# Patient Record
Sex: Male | Born: 1960 | Race: White | Hispanic: No | Marital: Married | Smoking: Never smoker
Health system: Southern US, Community
[De-identification: ages and names within clinical notes are randomized; demographics above are authoritative.]

## PROBLEM LIST (undated history)

## (undated) DIAGNOSIS — I10 Essential (primary) hypertension: Secondary | ICD-10-CM

## (undated) HISTORY — PX: OTHER SURGICAL HISTORY: SHX169

## (undated) HISTORY — DX: Essential (primary) hypertension: I10

---

## 2000-02-05 ENCOUNTER — Observation Stay (HOSPITAL_COMMUNITY): Admission: EM | Admit: 2000-02-05 | Discharge: 2000-02-06 | Payer: Self-pay | Admitting: Emergency Medicine

## 2001-12-22 ENCOUNTER — Ambulatory Visit (HOSPITAL_COMMUNITY): Admission: RE | Admit: 2001-12-22 | Discharge: 2001-12-22 | Payer: Self-pay | Admitting: Orthopedic Surgery

## 2001-12-22 ENCOUNTER — Encounter: Payer: Self-pay | Admitting: Orthopedic Surgery

## 2001-12-24 ENCOUNTER — Encounter: Payer: Self-pay | Admitting: Orthopedic Surgery

## 2001-12-24 ENCOUNTER — Encounter: Admission: RE | Admit: 2001-12-24 | Discharge: 2001-12-24 | Payer: Self-pay | Admitting: Radiology

## 2002-02-07 ENCOUNTER — Encounter: Admission: RE | Admit: 2002-02-07 | Discharge: 2002-02-07 | Payer: Self-pay | Admitting: Orthopedic Surgery

## 2002-02-07 ENCOUNTER — Encounter: Payer: Self-pay | Admitting: Orthopedic Surgery

## 2005-04-17 ENCOUNTER — Ambulatory Visit: Payer: Self-pay | Admitting: Internal Medicine

## 2005-04-25 ENCOUNTER — Ambulatory Visit: Payer: Self-pay | Admitting: Internal Medicine

## 2006-04-19 ENCOUNTER — Ambulatory Visit: Payer: Self-pay | Admitting: Internal Medicine

## 2006-04-24 ENCOUNTER — Ambulatory Visit: Payer: Self-pay | Admitting: Internal Medicine

## 2006-11-14 ENCOUNTER — Encounter: Admission: RE | Admit: 2006-11-14 | Discharge: 2006-11-14 | Payer: Self-pay | Admitting: Orthopedic Surgery

## 2007-06-10 DIAGNOSIS — I10 Essential (primary) hypertension: Secondary | ICD-10-CM | POA: Insufficient documentation

## 2007-11-18 ENCOUNTER — Telehealth: Payer: Self-pay | Admitting: Internal Medicine

## 2008-06-24 ENCOUNTER — Telehealth: Payer: Self-pay | Admitting: Internal Medicine

## 2008-09-17 ENCOUNTER — Telehealth (INDEPENDENT_AMBULATORY_CARE_PROVIDER_SITE_OTHER): Payer: Self-pay | Admitting: *Deleted

## 2008-09-18 ENCOUNTER — Ambulatory Visit: Payer: Self-pay | Admitting: Internal Medicine

## 2008-09-18 LAB — CONVERTED CEMR LAB
ALT: 55 units/L — ABNORMAL HIGH (ref 0–53)
AST: 40 units/L — ABNORMAL HIGH (ref 0–37)
Albumin: 4.2 g/dL (ref 3.5–5.2)
Alkaline Phosphatase: 73 units/L (ref 39–117)
BUN: 16 mg/dL (ref 6–23)
Basophils Absolute: 0 10*3/uL (ref 0.0–0.1)
Basophils Relative: 0.5 % (ref 0.0–3.0)
Bilirubin Urine: NEGATIVE
Bilirubin, Direct: 0.2 mg/dL (ref 0.0–0.3)
Blood in Urine, dipstick: NEGATIVE
CO2: 32 meq/L (ref 19–32)
Calcium: 9.6 mg/dL (ref 8.4–10.5)
Chloride: 102 meq/L (ref 96–112)
Cholesterol: 165 mg/dL (ref 0–200)
Creatinine, Ser: 0.9 mg/dL (ref 0.4–1.5)
Eosinophils Absolute: 0.2 10*3/uL (ref 0.0–0.7)
Eosinophils Relative: 4 % (ref 0.0–5.0)
GFR calc Af Amer: 115 mL/min
GFR calc non Af Amer: 95 mL/min
Glucose, Bld: 91 mg/dL (ref 70–99)
Glucose, Urine, Semiquant: NEGATIVE
HCT: 46.3 % (ref 39.0–52.0)
HDL: 31.9 mg/dL — ABNORMAL LOW (ref >39.0)
Hemoglobin: 16.4 g/dL (ref 13.0–17.0)
Ketones, urine, test strip: NEGATIVE
LDL Cholesterol: 107 mg/dL — ABNORMAL HIGH (ref 0–99)
Lymphocytes Relative: 37.5 % (ref 12.0–46.0)
MCHC: 35.5 g/dL (ref 30.0–36.0)
MCV: 90.6 fL (ref 78.0–100.0)
Monocytes Absolute: 0.6 10*3/uL (ref 0.1–1.0)
Monocytes Relative: 10.7 % (ref 3.0–12.0)
Neutro Abs: 2.7 10*3/uL (ref 1.4–7.7)
Neutrophils Relative %: 47.3 % (ref 43.0–77.0)
Nitrite: NEGATIVE
Platelets: 220 10*3/uL (ref 150–400)
Potassium: 3.5 meq/L (ref 3.5–5.1)
Protein, U semiquant: NEGATIVE
RBC: 5.1 M/uL (ref 4.22–5.81)
RDW: 11.9 % (ref 11.5–14.6)
Sodium: 143 meq/L (ref 135–145)
Specific Gravity, Urine: 1.01
TSH: 0.61 microintl units/mL (ref 0.35–5.50)
Total Bilirubin: 2.1 mg/dL — ABNORMAL HIGH (ref 0.3–1.2)
Total CHOL/HDL Ratio: 5.2
Total Protein: 7.2 g/dL (ref 6.0–8.3)
Triglycerides: 129 mg/dL (ref 0–149)
Urobilinogen, UA: 0.2
VLDL: 26 mg/dL (ref 0–40)
WBC Urine, dipstick: NEGATIVE
WBC: 5.6 10*3/uL (ref 4.5–10.5)
pH: 7

## 2008-09-23 ENCOUNTER — Ambulatory Visit: Payer: Self-pay | Admitting: Internal Medicine

## 2008-09-23 DIAGNOSIS — E785 Hyperlipidemia, unspecified: Secondary | ICD-10-CM | POA: Insufficient documentation

## 2008-09-24 ENCOUNTER — Telehealth: Payer: Self-pay | Admitting: *Deleted

## 2008-10-05 ENCOUNTER — Encounter: Payer: Self-pay | Admitting: Internal Medicine

## 2009-01-01 ENCOUNTER — Telehealth: Payer: Self-pay | Admitting: Internal Medicine

## 2009-03-22 ENCOUNTER — Ambulatory Visit: Payer: Self-pay | Admitting: Internal Medicine

## 2009-03-22 LAB — CONVERTED CEMR LAB
ALT: 47 units/L (ref 0–53)
AST: 36 units/L (ref 0–37)
Albumin: 4.1 g/dL (ref 3.5–5.2)
Alkaline Phosphatase: 67 units/L (ref 39–117)
Bilirubin, Direct: 0.1 mg/dL (ref 0.0–0.3)
Cholesterol: 168 mg/dL (ref 0–200)
HDL: 43.4 mg/dL (ref >39.00)
LDL Cholesterol: 93 mg/dL (ref 0–99)
Total Bilirubin: 1.6 mg/dL — ABNORMAL HIGH (ref 0.3–1.2)
Total CHOL/HDL Ratio: 4
Total Protein: 7.3 g/dL (ref 6.0–8.3)
Triglycerides: 159 mg/dL — ABNORMAL HIGH (ref 0.0–149.0)
VLDL: 31.8 mg/dL (ref 0.0–40.0)

## 2009-03-24 ENCOUNTER — Ambulatory Visit: Payer: Self-pay | Admitting: Internal Medicine

## 2009-03-24 DIAGNOSIS — R0789 Other chest pain: Secondary | ICD-10-CM | POA: Insufficient documentation

## 2009-03-30 ENCOUNTER — Telehealth (INDEPENDENT_AMBULATORY_CARE_PROVIDER_SITE_OTHER): Payer: Self-pay | Admitting: *Deleted

## 2009-03-31 ENCOUNTER — Encounter: Payer: Self-pay | Admitting: Internal Medicine

## 2009-03-31 ENCOUNTER — Ambulatory Visit: Payer: Self-pay

## 2009-03-31 ENCOUNTER — Encounter: Payer: Self-pay | Admitting: Cardiology

## 2009-04-05 ENCOUNTER — Telehealth: Payer: Self-pay | Admitting: Internal Medicine

## 2009-05-07 ENCOUNTER — Ambulatory Visit: Payer: Self-pay | Admitting: Internal Medicine

## 2009-07-14 ENCOUNTER — Telehealth: Payer: Self-pay | Admitting: Internal Medicine

## 2009-09-01 ENCOUNTER — Encounter (INDEPENDENT_AMBULATORY_CARE_PROVIDER_SITE_OTHER): Payer: Self-pay | Admitting: *Deleted

## 2010-06-07 ENCOUNTER — Telehealth: Payer: Self-pay | Admitting: Internal Medicine

## 2010-07-01 ENCOUNTER — Telehealth: Payer: Self-pay | Admitting: Internal Medicine

## 2010-11-06 ENCOUNTER — Encounter
Admission: RE | Admit: 2010-11-06 | Discharge: 2010-11-06 | Payer: Self-pay | Source: Home / Self Care | Attending: Orthopedic Surgery | Admitting: Orthopedic Surgery

## 2010-11-15 NOTE — Progress Notes (Signed)
  Phone Note Call from Patient   Summary of Call: wife requesting last cpx vitals and labs- wife informed ready for pcik up[ Initial call taken by: Willy Eddy, LPN,  June 07, 2010 3:30 PM

## 2010-11-15 NOTE — Progress Notes (Signed)
Summary: Kyle Mcdaniel  Phone Note Call from Patient   Caller: wife,barbara [phone] Summary of Call: Ambien for travel & jet lag.  Trip Oct. [facility] Should have time.   [name] Initial call taken by: Rudy Jew, RN,  July 01, 2010 8:25 AM  Follow-up for Phone Call        may  have ambien 10 mg #20 1 at bedtime pam sleep with 1 refill per dr Lovell Sheehan Follow-up by: Willy Eddy, LPN,  July 01, 2010 8:51 AM  Additional Follow-up for Phone Call Additional follow up Details #1::        Phone Call Completed Additional Follow-up by: Rudy Jew, RN,  July 01, 2010 9:34 AM    New/Updated Medications: AMBIEN 10 MG TABS (ZOLPIDEM TARTRATE) One hs as needed sleep Prescriptions: AMBIEN 10 MG TABS (ZOLPIDEM TARTRATE) One hs as needed sleep  #20 x 1   Entered by:   Rudy Jew, RN   Authorized by:   Stacie Glaze MD   Signed by:   Rudy Jew, RN on 07/01/2010   Method used:   Printed then faxed to ...       [facility] (mail-order)             , [place]         Ph: [phone]       Fax: [phone]   RxID:   [id number]

## 2011-03-03 NOTE — Op Note (Signed)
[facility]  Patient:    Kyle Mcdaniel, Kyle Mcdaniel                         MRN: [id number] Proc. Date: 02/05/00 Adm. Date:  [id number] Disc. Date: [id number] Attending:  Aldean Baker V                           Operative Report  PREOPERATIVE DIAGNOSIS:  Ruptured right Achilles tendon.  POSTOPERATIVE DIAGNOSIS:  Complete rupture of right Achilles tendon.  PROCEDURE:  Repair of right Achilles tendon.  SURGEON:  Nadara Mustard, M.D.  ANESTHESIA:  General endotracheal.  ESTIMATED BLOOD LOSS:  Minimal.  ANTIBIOTICS:  One gram of Kefzol.  TOURNIQUET TIME:  Forty-nine minutes at 300 mmHg.  DISPOSITION:  To PACU in stable condition.  INDICATIONS FOR PROCEDURE:  The patient is a 50 year old gentleman who was playing basketball and felt a pop in his right Achilles tendon yesterday, and is brought today for repair of his right Achilles tendon.  He had a palpable defect and a positive Thompson test and did not have plantar flexion with compression of the  calf.  The risks and benefits of surgery versus conservative nonoperative care ere discussed including infection, neurovascular injury, skin wound healing, rupture rate of the tendon, motor strength of the ankle, as well as regaining calf circumference.  The patient states he understands the risks and the complications of surgery, and wish to proceed at this time.  DESCRIPTION OF PROCEDURE:  The patient was brought to OR #14 and underwent a general anesthetic.  After an adequate level of anesthesia was obtained, the patient was placed prone on the operating table.  His right lower extremity was  prepped using DuraPrep, and draped into the sterile field.  An Collier Flowers was used to cover all exposed skin.  A posteromedial incision was made directly over the Achilles tendon which was carried down to the peri-Tenon which was split.  The Achilles tendon was completely ruptured, mid-substance.  The plantaris  tendon was intact.  The wound was irrigated.  Using two #2-0 Ethibond sutures, double-arm, two Krackow sutures were placed in the proximal stump with four sutures extending at the substance of the end, and two #2-0 Ethibond sutures were placed in the distal stump with four strands exiting the distal end.  This was then repaired with the ________.  The repair was then reinforced with 2-0 Ethibond as well with a total of eight sutures crossing the rupture.  The wound was then again irrigated. The peri-Tenon was closed using a running 3-0 Monocryl.  The skin was closed using n Allgower suture stitch with 2-0 nylon with no suture crossing the incision over the skin.  The wound was covered with Adaptic and orthopedic sponges, and a compressive Quincy Simmonds dressing was applied.  The patient was extubated and taken to PACU in stable condition.  Total tourniquet time 39 minutes.  Elevation was used for exsanguination. DD:  02/05/00 TD:  02/06/00 Job: [id number] UXL/[id number]

## 2011-03-12 ENCOUNTER — Other Ambulatory Visit: Payer: Self-pay | Admitting: Internal Medicine

## 2011-04-12 ENCOUNTER — Telehealth: Payer: Self-pay | Admitting: Internal Medicine

## 2011-04-12 MED ORDER — OLMESARTAN MEDOXOMIL-HCTZ 40-25 MG PO TABS: 1.0000 | ORAL_TABLET | Freq: Every day | ORAL | Status: DC

## 2011-04-12 NOTE — Telephone Encounter (Signed)
Pt req to get work in ov with fasting labs prior to visit, so pt can get refill of med. Also req samples of Benicar /diurectic or call in to [facility].

## 2011-04-12 NOTE — Telephone Encounter (Signed)
Just will have to give an open appointment and dr Lovell Sheehan will have to order labs when here- has not been seen in over 2 years and he will need to see him before ordering labs- no samples available

## 2011-04-12 NOTE — Telephone Encounter (Signed)
Disregard first message- schedule cpx in next 3 months and will give him refills for 3 months to schedule cpx

## 2011-05-26 ENCOUNTER — Other Ambulatory Visit: Payer: Self-pay

## 2011-05-26 ENCOUNTER — Other Ambulatory Visit: Payer: Self-pay | Admitting: Internal Medicine

## 2011-05-26 DIAGNOSIS — Z0000 Encounter for general adult medical examination without abnormal findings: Secondary | ICD-10-CM

## 2011-05-29 ENCOUNTER — Other Ambulatory Visit (INDEPENDENT_AMBULATORY_CARE_PROVIDER_SITE_OTHER): Payer: Self-pay

## 2011-05-29 DIAGNOSIS — Z0000 Encounter for general adult medical examination without abnormal findings: Secondary | ICD-10-CM

## 2011-05-29 LAB — HEPATIC FUNCTION PANEL
ALT: 34 U/L (ref 0–53)
Alkaline Phosphatase: 60 U/L (ref 39–117)
Bilirubin, Direct: 0.2 mg/dL (ref 0.0–0.3)
Total Bilirubin: 1.5 mg/dL — ABNORMAL HIGH (ref 0.3–1.2)
Total Protein: 7.4 g/dL (ref 6.0–8.3)

## 2011-05-29 LAB — BASIC METABOLIC PANEL
Calcium: 9.2 mg/dL (ref 8.4–10.5)
Chloride: 100 mEq/L (ref 96–112)
Creatinine, Ser: 1 mg/dL (ref 0.4–1.5)
GFR: 85.43 mL/min (ref >60.00)

## 2011-05-29 LAB — URINALYSIS
Ketones, ur: NEGATIVE
Specific Gravity, Urine: 1.02 (ref 1.000–1.030)
Total Protein, Urine: NEGATIVE
Urine Glucose: NEGATIVE
pH: 7 (ref 5.0–8.0)

## 2011-05-29 LAB — CBC WITH DIFFERENTIAL/PLATELET
Basophils Relative: 0.8 % (ref 0.0–3.0)
Eosinophils Absolute: 0.1 10*3/uL (ref 0.0–0.7)
Eosinophils Relative: 2.9 % (ref 0.0–5.0)
HCT: 45.1 % (ref 39.0–52.0)
Lymphs Abs: 1.6 10*3/uL (ref 0.7–4.0)
MCHC: 34.6 g/dL (ref 30.0–36.0)
MCV: 90 fl (ref 78.0–100.0)
Monocytes Absolute: 0.6 10*3/uL (ref 0.1–1.0)
Neutrophils Relative %: 52.6 % (ref 43.0–77.0)
Platelets: 233 10*3/uL (ref 150.0–400.0)
RBC: 5.01 Mil/uL (ref 4.22–5.81)

## 2011-05-29 LAB — LIPID PANEL
Cholesterol: 165 mg/dL (ref 0–200)
Triglycerides: 147 mg/dL (ref 0.0–149.0)

## 2011-05-30 ENCOUNTER — Encounter: Payer: Self-pay | Admitting: Internal Medicine

## 2011-06-02 ENCOUNTER — Ambulatory Visit (INDEPENDENT_AMBULATORY_CARE_PROVIDER_SITE_OTHER): Payer: BC Managed Care – PPO | Admitting: Internal Medicine

## 2011-06-02 ENCOUNTER — Encounter: Payer: Self-pay | Admitting: Internal Medicine

## 2011-06-02 VITALS — BP 160/90 | HR 80 | Temp 98.2°F | Resp 16 | Ht 72.0 in | Wt 242.0 lb

## 2011-06-02 DIAGNOSIS — I10 Essential (primary) hypertension: Secondary | ICD-10-CM

## 2011-06-02 DIAGNOSIS — Z125 Encounter for screening for malignant neoplasm of prostate: Secondary | ICD-10-CM

## 2011-06-02 DIAGNOSIS — Z0000 Encounter for general adult medical examination without abnormal findings: Secondary | ICD-10-CM

## 2011-06-02 MED ORDER — ATENOLOL 50 MG PO TABS: 50.0000 mg | ORAL_TABLET | Freq: Every day | ORAL | Status: DC

## 2011-06-02 NOTE — Progress Notes (Signed)
Subjective:    Patient ID: Kyle Mcdaniel, male    DOB: Oct 17, 1960, 50 y.o.   MRN: [id number]  HPI  Kyle Mcdaniel is a 50 year old white male presents for complete physical examination.  He's been doing remarkably well as been 2 years since his last physical examination.  He has been exercising on a regular basis he states the exercises for 45 minutes of cardio 3 times a week his blood pressure however is elevated and we have noticed a trend towards elevating blood pressure over the past year.  He states that there is a strong family history for hypertension in his father has hypertension difficult to control. He has been on his current medications for greater than 2 years he's recently changed to generic Toprol  Review of Systems  Constitutional: Negative for fever and fatigue.  HENT: Negative for hearing loss, congestion, neck pain and postnasal drip.   Eyes: Negative for discharge, redness and visual disturbance.  Respiratory: Negative for cough, shortness of breath and wheezing.   Cardiovascular: Negative for leg swelling.  Gastrointestinal: Negative for abdominal pain, constipation and abdominal distention.  Genitourinary: Negative for urgency and frequency.  Musculoskeletal: Negative for joint swelling and arthralgias.  Skin: Negative for color change and rash.  Neurological: Negative for weakness and light-headedness.  Hematological: Negative for adenopathy.  Psychiatric/Behavioral: Negative for behavioral problems.   Past Medical History  Diagnosis Date  . Hypertension    Past Surgical History  Procedure Date  . Sebacceous cyst of knee     reports that he has never smoked. He does not have any smokeless tobacco history on file. He reports that he drinks alcohol. He reports that he does not use illicit drugs. family history includes Hypertension in an unspecified family member. No Known Allergies     Objective:   Physical Exam  Constitutional: He is oriented to person,  place, and time. He appears well-developed and well-nourished.  HENT:  Head: Normocephalic and atraumatic.  Eyes: Conjunctivae are normal. Pupils are equal, round, and reactive to light.  Neck: Normal range of motion. Neck supple.  Cardiovascular: Normal rate and regular rhythm.   Pulmonary/Chest: Effort normal and breath sounds normal.  Abdominal: Soft. Bowel sounds are normal.  Genitourinary: Rectum normal and prostate normal.  Musculoskeletal: Normal range of motion.       Some tenderness over the anterior cruciate ligament on the right  Neurological: He is alert and oriented to person, place, and time.  Skin: Skin is warm and dry.  Psychiatric: He has a normal mood and affect. His behavior is normal. Thought content normal.          Assessment & Plan:   Patient presents for yearly preventative medicine examination.   all immunizations and health maintenance protocols were reviewed with the patient and they are up to date with these protocols.   screening laboratory values were reviewed with the patient including screening of hyperlipidemia PSA renal function and hepatic function.   There medications past medical history social history problem list and allergies were reviewed in detail.   Goals were established with regard to weight loss exercise diet in compliance with medications   Given his family his factors control her blood pressure is essential his weight is stable believed that the generic Toprol may pay a full because he has been responsive to beta blockers in the past his pulse by my examination was 95. We will change him from Toprol to atenolol 50 mg by mouth daily and monitor  his blood pressure we have also added a PSA which is based screening labs we will follow the results and notify

## 2011-06-03 LAB — PSA, TOTAL AND FREE
PSA, Free Pct: 41 % (ref >25)
PSA, Free: 0.15 ng/mL

## 2011-06-10 ENCOUNTER — Other Ambulatory Visit: Payer: Self-pay | Admitting: Internal Medicine

## 2011-06-14 MED ORDER — METOPROLOL SUCCINATE ER 25 MG PO TB24: 25.0000 mg | ORAL_TABLET | Freq: Every day | ORAL | Status: DC

## 2011-06-15 ENCOUNTER — Telehealth: Payer: Self-pay | Admitting: *Deleted

## 2011-06-15 ENCOUNTER — Other Ambulatory Visit: Payer: Self-pay | Admitting: *Deleted

## 2011-06-15 MED ORDER — AMLODIPINE BESYLATE 2.5 MG PO TABS: 2.5000 mg | ORAL_TABLET | Freq: Every day | ORAL | Status: DC

## 2011-06-15 NOTE — Telephone Encounter (Signed)
When I called pt about psa he compolained of not being able to get heart rate up when working out and wants to go back on atenolol 25. Per dr Lovell Sheehan- may decrease atenolol to 25 ( half of 50) qd and add amlodipine 2.5 qd to [facility]

## 2011-08-25 ENCOUNTER — Other Ambulatory Visit: Payer: Self-pay | Admitting: Internal Medicine

## 2011-09-20 ENCOUNTER — Other Ambulatory Visit: Payer: Self-pay | Admitting: *Deleted

## 2011-09-20 DIAGNOSIS — I10 Essential (primary) hypertension: Secondary | ICD-10-CM

## 2011-09-20 MED ORDER — ATENOLOL 50 MG PO TABS: 50.0000 mg | ORAL_TABLET | Freq: Every day | ORAL | Status: DC

## 2011-09-22 ENCOUNTER — Telehealth: Payer: Self-pay | Admitting: Internal Medicine

## 2011-09-22 ENCOUNTER — Other Ambulatory Visit: Payer: Self-pay | Admitting: *Deleted

## 2011-09-22 DIAGNOSIS — I10 Essential (primary) hypertension: Secondary | ICD-10-CM

## 2011-09-22 MED ORDER — AMLODIPINE BESYLATE 2.5 MG PO TABS: 2.5000 mg | ORAL_TABLET | Freq: Every day | ORAL | Status: DC

## 2011-09-22 MED ORDER — OLMESARTAN MEDOXOMIL-HCTZ 40-25 MG PO TABS: 1.0000 | ORAL_TABLET | Freq: Every day | ORAL | Status: DC

## 2011-09-22 MED ORDER — ATENOLOL 50 MG PO TABS: 50.0000 mg | ORAL_TABLET | Freq: Every day | ORAL | Status: DC

## 2011-09-22 NOTE — Telephone Encounter (Signed)
Done

## 2011-09-22 NOTE — Telephone Encounter (Signed)
Pts scripts for amLODipine (NORVASC) 2.5 MG tablet,  BENICAR HCT 40-25 MG per tablet , atenolol (TENORMIN) 50 MG tablet, did not process through [facility]. Pt was told that his meds would not be shipped out until 09/27/11. Pt is needs to get a 2 wk supply of all 3 of these meds called in to local [facility] on [address] [phone], to last until mail order arrives. Pt almost out of med.

## 2011-12-26 ENCOUNTER — Other Ambulatory Visit: Payer: Self-pay | Admitting: Internal Medicine

## 2012-03-15 ENCOUNTER — Other Ambulatory Visit: Payer: Self-pay | Admitting: Internal Medicine

## 2012-03-15 MED ORDER — ZOLPIDEM TARTRATE 10 MG PO TABS: 10.0000 mg | ORAL_TABLET | Freq: Every evening | ORAL | Status: DC | PRN

## 2012-03-15 NOTE — Telephone Encounter (Signed)
Pt needs refill on ambien 10mg  call [facility] [address]. Pt would like a callback once medication has been sent to pharm.

## 2012-05-20 ENCOUNTER — Telehealth: Payer: Self-pay | Admitting: Internal Medicine

## 2012-05-20 NOTE — Telephone Encounter (Signed)
Pt is requesting to have labs drawn at [facility] on 05-23-2012. Pt is sch for lab work here on 05-24-2012. Please put order in system for patient to go to [facility] due to convenience

## 2012-05-20 NOTE — Telephone Encounter (Signed)
Pt was sch to go to [facility] on 05-24-12. Pt will go a day earlier to [facility] lab

## 2012-05-23 ENCOUNTER — Other Ambulatory Visit: Payer: BC Managed Care – PPO

## 2012-05-23 ENCOUNTER — Other Ambulatory Visit (INDEPENDENT_AMBULATORY_CARE_PROVIDER_SITE_OTHER): Payer: BC Managed Care – PPO

## 2012-05-23 ENCOUNTER — Other Ambulatory Visit: Payer: Self-pay | Admitting: *Deleted

## 2012-05-23 DIAGNOSIS — Z0000 Encounter for general adult medical examination without abnormal findings: Secondary | ICD-10-CM

## 2012-05-23 LAB — BASIC METABOLIC PANEL
BUN: 14 mg/dL (ref 6–23)
CO2: 31 mEq/L (ref 19–32)
Chloride: 100 mEq/L (ref 96–112)
Creatinine, Ser: 0.8 mg/dL (ref 0.4–1.5)

## 2012-05-23 LAB — CBC WITH DIFFERENTIAL/PLATELET
Basophils Absolute: 0 10*3/uL (ref 0.0–0.1)
Eosinophils Absolute: 0.4 10*3/uL (ref 0.0–0.7)
Lymphocytes Relative: 36.2 % (ref 12.0–46.0)
MCHC: 34.6 g/dL (ref 30.0–36.0)
Neutrophils Relative %: 44.3 % (ref 43.0–77.0)
RBC: 5.13 Mil/uL (ref 4.22–5.81)
RDW: 12.3 % (ref 11.5–14.6)

## 2012-05-23 LAB — LIPID PANEL
Cholesterol: 155 mg/dL (ref 0–200)
LDL Cholesterol: 75 mg/dL (ref 0–99)
VLDL: 34 mg/dL (ref 0.0–40.0)

## 2012-05-23 LAB — URINALYSIS
Bilirubin Urine: NEGATIVE
Hgb urine dipstick: NEGATIVE
Ketones, ur: NEGATIVE
Nitrite: NEGATIVE
Total Protein, Urine: NEGATIVE

## 2012-05-23 LAB — HEPATIC FUNCTION PANEL
Alkaline Phosphatase: 62 U/L (ref 39–117)
Bilirubin, Direct: 0.2 mg/dL (ref 0.0–0.3)

## 2012-05-23 LAB — PSA: PSA: 0.37 ng/mL (ref 0.10–4.00)

## 2012-05-24 ENCOUNTER — Other Ambulatory Visit: Payer: BC Managed Care – PPO

## 2012-05-31 ENCOUNTER — Ambulatory Visit (INDEPENDENT_AMBULATORY_CARE_PROVIDER_SITE_OTHER): Payer: BC Managed Care – PPO | Admitting: Internal Medicine

## 2012-05-31 ENCOUNTER — Encounter: Payer: Self-pay | Admitting: Internal Medicine

## 2012-05-31 VITALS — BP 140/90 | HR 72 | Temp 98.3°F | Resp 16 | Ht 72.0 in | Wt 244.0 lb

## 2012-05-31 DIAGNOSIS — E663 Overweight: Secondary | ICD-10-CM

## 2012-05-31 DIAGNOSIS — Z8249 Family history of ischemic heart disease and other diseases of the circulatory system: Secondary | ICD-10-CM

## 2012-05-31 DIAGNOSIS — Z0000 Encounter for general adult medical examination without abnormal findings: Secondary | ICD-10-CM

## 2012-05-31 DIAGNOSIS — E785 Hyperlipidemia, unspecified: Secondary | ICD-10-CM

## 2012-05-31 DIAGNOSIS — I10 Essential (primary) hypertension: Secondary | ICD-10-CM

## 2012-05-31 NOTE — Patient Instructions (Signed)
You'll be contacted by my office as scheduled Cardiolite

## 2012-05-31 NOTE — Progress Notes (Signed)
Subjective:    Patient ID: Kyle Mcdaniel, male    DOB: 1961/07/12, 51 y.o.   MRN: [id number]  HPI This is a 51 year old male who presents for his routine yearly physical examination his cardiovascular risk factors include hyperlipidemia hypertension and a history of atypical chest pain he states the chest pain is not exertional but sometimes time he feels midsternal chest pain that is unrelated to rest or stress and cannot be related to food or gas.  He does have cardiovascular risk factors including family history hypertension hyperlipidemia and age greater than 83 which gives him an increased cardiovascular risk score   Review of Systems  Constitutional: Negative for fever and fatigue.  HENT: Negative for hearing loss, congestion, neck pain and postnasal drip.   Eyes: Negative for discharge, redness and visual disturbance.  Respiratory: Negative for cough, shortness of breath and wheezing.   Cardiovascular: Positive for chest pain. Negative for leg swelling.  Gastrointestinal: Negative for abdominal pain, constipation and abdominal distention.  Genitourinary: Negative for urgency and frequency.  Musculoskeletal: Negative for joint swelling and arthralgias.  Skin: Negative for color change and rash.  Neurological: Negative for weakness and light-headedness.  Hematological: Negative for adenopathy.  Psychiatric/Behavioral: Negative for behavioral problems.   Past Medical History  Diagnosis Date  . Hypertension     History   Social History  . Marital Status: Married    Spouse Name: N/A    Number of Children: N/A  . Years of Education: N/A   Occupational History  . Not on file.   Social History Main Topics  . Smoking status: Never Smoker   . Smokeless tobacco: Not on file  . Alcohol Use: Yes  . Drug Use: No  . Sexually Active: Not on file   Other Topics Concern  . Not on file   Social History Narrative  . No narrative on file    Past Surgical History  Procedure  Date  . Sebacceous cyst of knee     Family History  Problem Relation Age of Onset  . Hypertension      No Known Allergies  Current Outpatient Prescriptions on File Prior to Visit  Medication Sig Dispense Refill  . amLODipine (NORVASC) 2.5 MG tablet Take 1 tablet (2.5 mg total) by mouth daily.  15 tablet  1  . aspirin 81 MG tablet Take 81 mg by mouth daily.        Marland Kitchen atenolol (TENORMIN) 50 MG tablet Take 25 mg by mouth daily.      . niacin 250 MG CR capsule Take 250 mg by mouth 2 (two) times daily.        . Omega-3 Fatty Acids (FISH OIL) 500 MG CAPS Take 500 capsules by mouth.        . zolpidem (AMBIEN) 10 MG tablet Take 1 tablet (10 mg total) by mouth at bedtime as needed.  30 tablet  3  . atorvastatin (LIPITOR) 10 MG tablet TAKE 1 TABLET DAILY  90 tablet  1  . BENICAR HCT 40-25 MG per tablet TAKE 1 TABLET DAILY (NEEDS OFFICE VISIT )  90 tablet  0    BP 140/90  Pulse 72  Temp 98.3 F (36.8 C)  Resp 16  Ht 6' (1.829 m)  Wt 244 lb (110.678 kg)  BMI 33.09 kg/m2       Objective:   Physical Exam  Nursing note and vitals reviewed. Constitutional: He is oriented to person, place, and time. He appears well-developed and well-nourished.  HENT:  Head: Normocephalic and atraumatic.  Eyes: Conjunctivae are normal. Pupils are equal, round, and reactive to light.  Neck: Normal range of motion. Neck supple.  Cardiovascular: Normal rate and regular rhythm.   Pulmonary/Chest: Effort normal and breath sounds normal.  Abdominal: Soft. Bowel sounds are normal.  Genitourinary: Rectum normal and prostate normal.  Musculoskeletal: Normal range of motion.  Neurological: He is alert and oriented to person, place, and time.  Skin: Skin is warm and dry.  Psychiatric: He has a normal mood and affect. His behavior is normal.          Assessment & Plan:   Patient presents for yearly preventative medicine examination.   all immunizations and health maintenance protocols were reviewed with  the patient and they are up to date with these protocols.   screening laboratory values were reviewed with the patient including screening of hyperlipidemia PSA renal function and hepatic function.   There medications past medical history social history problem list and allergies were reviewed in detail.   Goals were established with regard to weight loss exercise diet in compliance with medications    Patient is referred to cardiology for a cardiovascular stress test. Indications for the stress test as his cardiovascular risk of 3 or greater and atypical chest pain.  Discussion of blood pressure control weight loss exercise discussion of diet and a gluten-free diet.

## 2012-06-11 ENCOUNTER — Other Ambulatory Visit: Payer: Self-pay | Admitting: Internal Medicine

## 2012-06-11 ENCOUNTER — Ambulatory Visit (HOSPITAL_COMMUNITY): Payer: BC Managed Care – PPO | Attending: Internal Medicine | Admitting: Radiology

## 2012-06-11 VITALS — BP 130/94 | HR 71 | Ht 72.0 in | Wt 246.0 lb

## 2012-06-11 DIAGNOSIS — I10 Essential (primary) hypertension: Secondary | ICD-10-CM

## 2012-06-11 DIAGNOSIS — E785 Hyperlipidemia, unspecified: Secondary | ICD-10-CM

## 2012-06-11 DIAGNOSIS — E663 Overweight: Secondary | ICD-10-CM

## 2012-06-11 DIAGNOSIS — R0789 Other chest pain: Secondary | ICD-10-CM

## 2012-06-11 DIAGNOSIS — Z8249 Family history of ischemic heart disease and other diseases of the circulatory system: Secondary | ICD-10-CM

## 2012-06-11 DIAGNOSIS — R9431 Abnormal electrocardiogram [ECG] [EKG]: Secondary | ICD-10-CM

## 2012-06-11 DIAGNOSIS — E669 Obesity, unspecified: Secondary | ICD-10-CM | POA: Insufficient documentation

## 2012-06-11 MED ORDER — TECHNETIUM TC 99M TETROFOSMIN IV KIT
11.0000 | PACK | Freq: Once | INTRAVENOUS | Status: AC | PRN
  Administered 2012-06-11: 11 via INTRAVENOUS

## 2012-06-11 MED ORDER — TECHNETIUM TC 99M TETROFOSMIN IV KIT
33.0000 | PACK | Freq: Once | INTRAVENOUS | Status: AC | PRN
  Administered 2012-06-11: 33 via INTRAVENOUS

## 2012-06-11 NOTE — Progress Notes (Signed)
[facility] 3 NUCLEAR MED [address] [place] [address] [phone]  Cardiology Nuclear Med Study  Kyle Mcdaniel is a 51 y.o. male     MRN : [id number]     DOB: 06/22/1961  Procedure Date: 06/11/2012  Nuclear Med Background Indication for Stress Test:  Evaluation for Ischemia and Abnormal EKG History:  '10 OZH:YQMVHQ, EF=58% Cardiac Risk Factors: Hypertension, Lipids and Obesity  Symptoms:  No cardiac complaints.   Nuclear Pre-Procedure Caffeine/Decaff Intake:  8:30pm NPO After: 8:30pm   Lungs:  Clear. IV 0.9% NS with Angio Cath:  20g  IV Site: R Hand  IV Started by:  Cathlyn Parsons, RN  Chest Size (in):  46 Cup Size: n/a  Height: 6' (1.829 m)  Weight:  246 lb (111.585 kg)  BMI:  Body mass index is 33.36 kg/(m^2). Tech Comments:  Atenolol held x 24 hours    Nuclear Med Study 1 or 2 day study: 1 day  Stress Test Type:  Stress  Reading MD: Marca Ancona, MD  Order Authorizing Provider:  Peri Jefferson  Resting Radionuclide: Technetium 14m Tetrofosmin  Resting Radionuclide Dose: 11.0 mCi   Stress Radionuclide:  Technetium 74m Tetrofosmin  Stress Radionuclide Dose: 33.0 mCi           Stress Protocol Rest HR: 71 Stress HR: 153  Rest BP: 130/94 Stress BP: 184/85  Exercise Time (min): 11:00 METS: 13.4   Predicted Max HR: 168 bpm % Max HR: 91.07 bpm Rate Pressure Product: 46962   Dose of Adenosine (mg):  n/a Dose of Lexiscan: n/a mg  Dose of Atropine (mg): n/a Dose of Dobutamine: n/a mcg/kg/min (at max HR)  Stress Test Technologist: Smiley Houseman, CMA-N  Nuclear Technologist:  Domenic Polite, CNMT     Rest Procedure:  Myocardial perfusion imaging was performed at rest 45 minutes following the intravenous administration of Technetium 39m Tetrofosmin.  Rest ECG: Nonspecific ST-T wave changes.  Stress Procedure:  The patient exercised on the treadmill utilizing the Bruce protocol for eleven minutes. He then stopped due to fatigue and  denied any chest pain.  There were no diagnostic ST-T wave changes.  Technetium 73m Tetrofosmin was injected at peak exercise and myocardial perfusion imaging was performed after a brief delay.  Stress ECG: No significant change from baseline ECG  QPS Raw Data Images:  Normal; no motion artifact; normal heart/lung ratio. Stress Images:  Small, mild apical perfusion defect.  Rest Images:  Small, mild apical perfusion defect.  Subtraction (SDS):  Fixed small mild apical perfusion defect.  Transient Ischemic Dilatation (Normal <1.22):  0.94 Lung/Heart Ratio (Normal <0.45):  0.40  Quantitative Gated Spect Images QGS EDV:  136 ml QGS ESV:  58 ml  Impression Exercise Capacity:  Good exercise capacity. BP Response:  Normal blood pressure response. Clinical Symptoms:  Fatigue.  ECG Impression:  Insignificant upsloping ST segment depression. Comparison with Prior Nuclear Study: No images to compare  Overall Impression:  Normal stress nuclear study.  Fixed small mild apical perfusion defect with normal wall motion likely represents apical thinning.   LV Ejection Fraction: 57%.  LV Wall Motion:  NL LV Function; NL Wall Motion  Marca Ancona 06/11/2012

## 2012-09-23 ENCOUNTER — Other Ambulatory Visit: Payer: Self-pay | Admitting: Internal Medicine

## 2012-12-17 ENCOUNTER — Other Ambulatory Visit: Payer: Self-pay | Admitting: Internal Medicine

## 2013-01-13 ENCOUNTER — Telehealth: Payer: Self-pay | Admitting: Internal Medicine

## 2013-01-13 NOTE — Telephone Encounter (Signed)
Spoke with wife and she will call back with dates of Hep A and B  And if any other vaccines are needed.

## 2013-01-13 NOTE — Telephone Encounter (Signed)
Please call travel medicine at [facility]- the number is [phone]-they have all the answers

## 2013-01-13 NOTE — Telephone Encounter (Signed)
Pt is traveling to [place] at the end of April. Pt has heard it is end of Malaria season. Pt has heard conflicting reports on how to handle this. Would like you advice. Pt would like to know if he is up to date on vaccines. Pls review history and call pt.'s wife

## 2013-06-02 ENCOUNTER — Encounter: Payer: BC Managed Care – PPO | Admitting: Internal Medicine

## 2013-06-09 ENCOUNTER — Other Ambulatory Visit: Payer: Self-pay | Admitting: Internal Medicine

## 2013-07-28 ENCOUNTER — Telehealth: Payer: Self-pay | Admitting: Internal Medicine

## 2013-07-28 MED ORDER — ZOLPIDEM TARTRATE 10 MG PO TABS: 10.0000 mg | ORAL_TABLET | Freq: Every evening | ORAL | Status: DC | PRN

## 2013-07-28 NOTE — Telephone Encounter (Signed)
Pt wife called and stated that pt is leaving the country this weekend, and would like a refill of his zolpidem (AMBIEN) 10 MG tablet sent to [facility] on [address]. Please assist.

## 2013-07-28 NOTE — Telephone Encounter (Signed)
Sent to [address] and [facility]

## 2013-09-30 ENCOUNTER — Other Ambulatory Visit: Payer: Self-pay | Admitting: Internal Medicine

## 2013-09-30 ENCOUNTER — Telehealth: Payer: Self-pay | Admitting: Internal Medicine

## 2013-09-30 ENCOUNTER — Other Ambulatory Visit: Payer: Self-pay | Admitting: *Deleted

## 2013-09-30 MED ORDER — OLMESARTAN MEDOXOMIL-HCTZ 40-25 MG PO TABS: ORAL_TABLET | ORAL | Status: DC

## 2013-09-30 MED ORDER — AMLODIPINE BESYLATE 2.5 MG PO TABS: ORAL_TABLET | ORAL | Status: DC

## 2013-09-30 NOTE — Telephone Encounter (Signed)
done

## 2013-09-30 NOTE — Telephone Encounter (Signed)
Pt needs new rx on benicar#14 and amlodipine#14 sent to [facility] [address] [facility]

## 2013-12-22 ENCOUNTER — Telehealth: Payer: Self-pay | Admitting: Internal Medicine

## 2013-12-22 DIAGNOSIS — Z0000 Encounter for general adult medical examination without abnormal findings: Secondary | ICD-10-CM

## 2013-12-22 MED ORDER — OLMESARTAN MEDOXOMIL-HCTZ 40-25 MG PO TABS: ORAL_TABLET | ORAL | Status: DC

## 2013-12-22 NOTE — Telephone Encounter (Signed)
Future orders placed, rx sent in electronically

## 2013-12-22 NOTE — Telephone Encounter (Signed)
Correction:  Pt is having CPX visit on 04/10/14.  And [facility], please enter lab orders.

## 2013-12-22 NOTE — Telephone Encounter (Signed)
Pt is scheduled to come in for OV on 03/23/14 and needs a re-fill on olmesartan-hydrochlorothiazide (BENICAR HCT) 40-25 MG per tablet - [facility]

## 2013-12-25 ENCOUNTER — Telehealth: Payer: Self-pay | Admitting: Internal Medicine

## 2013-12-25 NOTE — Telephone Encounter (Signed)
[facility]gett [facility] calling regarding pt's rxolmesartan-hydrochlorothiazide (BENICAR HCT) 40-25 MG per tablet, the covered medication at this time is losartan hctz, irbesartan hctz, valsartan hctz, candesartan hctz, telmisartan hctz, states can take a new rx or assist with prior authorization.

## 2013-12-26 MED ORDER — TELMISARTAN-HCTZ 80-12.5 MG PO TABS
1.0000 | ORAL_TABLET | Freq: Every day | ORAL | Status: AC
Start: 2013-12-26 — End: ?

## 2013-12-26 NOTE — Telephone Encounter (Signed)
micardis 80/12.5 generic

## 2013-12-26 NOTE — Telephone Encounter (Signed)
rx sent in electronically 

## 2014-02-23 ENCOUNTER — Telehealth: Payer: Self-pay | Admitting: Internal Medicine

## 2014-02-23 MED ORDER — ZOLPIDEM TARTRATE 10 MG PO TABS: 10.0000 mg | ORAL_TABLET | Freq: Every evening | ORAL | Status: AC | PRN

## 2014-02-23 NOTE — Telephone Encounter (Signed)
Pt needs refill on generic ambien 10 mg call into [facility] on [address]. Pt is going to [place] today leaving for airport at 930 am. Pt wife is aware make take up to 3 business days.

## 2014-02-23 NOTE — Telephone Encounter (Signed)
Ok per Dr Assunta GamblesJenkins x1, needs ov, rx called in, pt aware

## 2014-03-23 ENCOUNTER — Ambulatory Visit: Payer: BC Managed Care – PPO | Admitting: Internal Medicine

## 2014-04-10 ENCOUNTER — Encounter: Payer: BC Managed Care – PPO | Admitting: Internal Medicine

## 2014-04-11 ENCOUNTER — Other Ambulatory Visit: Payer: Self-pay | Admitting: Internal Medicine

## 2014-05-05 ENCOUNTER — Telehealth: Payer: Self-pay | Admitting: Internal Medicine

## 2014-05-05 NOTE — Telephone Encounter (Signed)
Lm with wife for pt to call back and schedule with [facility]

## 2014-05-18 ENCOUNTER — Other Ambulatory Visit: Payer: Self-pay | Admitting: Internal Medicine

## 2014-05-19 ENCOUNTER — Other Ambulatory Visit: Payer: Self-pay | Admitting: Internal Medicine

## 2015-09-13 ENCOUNTER — Ambulatory Visit (INDEPENDENT_AMBULATORY_CARE_PROVIDER_SITE_OTHER): Payer: BLUE CROSS/BLUE SHIELD | Admitting: Podiatry

## 2015-09-13 DIAGNOSIS — L03011 Cellulitis of right finger: Secondary | ICD-10-CM

## 2015-09-13 DIAGNOSIS — M79671 Pain in right foot: Secondary | ICD-10-CM

## 2015-09-13 NOTE — Patient Instructions (Signed)

## 2015-09-14 ENCOUNTER — Telehealth: Payer: Self-pay | Admitting: *Deleted

## 2015-09-14 NOTE — Progress Notes (Signed)
Subjective:     Patient ID: Kyle RodriguezJeffrey H Rotz, male   DOB: 05/02/1961, 54 y.o.   MRN: [id number]  HPI patient states this toe is had some pain and drainage for a few weeks Tried to soak it and trimming without relief   Review of Systems  All other systems reviewed and are negative.      Objective:   Physical Exam  Constitutional: He is oriented to person, place, and time.  Cardiovascular: Intact distal pulses.   Musculoskeletal: Normal range of motion.  Neurological: He is oriented to person, place, and time.  Skin: Skin is warm.  Nursing note and vitals reviewed.  neurovascular status intact muscle strength adequate range of motion within normal limits with incurvated right hallux lateral border with distal redness and drainage noted and pain upon palpation     Assessment:     Paronychia infection right hallux lateral border localized in nature with no proximal indications of spread    Plan:     H&P and condition reviewed and recommended removal of the corner the nail. Patient wants the procedure and today I infiltrated the right hallux 56 Milligan's like Marcaine mixture remove the lateral corner proud flesh and allowed channel for drainage and applied sterile dressing. Reappoint to recheck again as needed and may require permanent procedure

## 2015-09-14 NOTE — Telephone Encounter (Signed)
Called patient at [phone] (Home #) to check to see how they were doing from their paronychia procedure that was performed on Monday, September 13, 2015. Patient's wife stated, "husband seems to be doing fine"..Marland Kitchen

## 2016-01-18 ENCOUNTER — Telehealth: Payer: Self-pay | Admitting: *Deleted

## 2016-01-18 NOTE — Telephone Encounter (Signed)
Entered in error

## 2016-02-09 ENCOUNTER — Ambulatory Visit (INDEPENDENT_AMBULATORY_CARE_PROVIDER_SITE_OTHER): Payer: BLUE CROSS/BLUE SHIELD | Admitting: Podiatry

## 2016-02-09 ENCOUNTER — Encounter: Payer: Self-pay | Admitting: Podiatry

## 2016-02-09 VITALS — BP 134/93 | HR 83 | Resp 16

## 2016-02-09 DIAGNOSIS — L600 Ingrowing nail: Secondary | ICD-10-CM

## 2016-02-09 NOTE — Progress Notes (Signed)
Subjective:     Patient ID: Kyle RodriguezJeffrey H Mcdaniel, male   DOB: 1961-02-11, 55 y.o.   MRN: [id number]  HPI patient presents with painful ingrown toenail right big toe lateral border that makes it hard for him to walk comfortably. We have worked on it 6 months ago and it has reoccurred   Review of Systems     Objective:   Physical Exam Neurovascular status intact muscle strength adequate with incurvated right hallux lateral border    Assessment:     Ingrown toenail deformity right hallux lateral border    Plan:     Reviewed condition and recommended correction of border. Explain permanent procedure and risk and today I infiltrated 60 mg Xylocaine Marcaine mixture remove the lateral border exposed matrix and applied phenol 3 applications 30 seconds followed by alcohol lavage and sterile dressing. Gave instructions on soaks and reappoint

## 2016-02-09 NOTE — Patient Instructions (Signed)

## 2016-02-16 ENCOUNTER — Telehealth: Payer: Self-pay | Admitting: *Deleted

## 2016-02-16 NOTE — Telephone Encounter (Signed)
Left message for patient at [phone] (Home #) to check to see how they were doing from their ingrown toenail procedure that was performed on Wednesday, February 09, 2016. Waiting for a response.

## 2016-05-05 DIAGNOSIS — I10 Essential (primary) hypertension: Secondary | ICD-10-CM | POA: Diagnosis not present

## 2016-05-05 DIAGNOSIS — E785 Hyperlipidemia, unspecified: Secondary | ICD-10-CM | POA: Diagnosis not present

## 2016-05-05 DIAGNOSIS — Z79899 Other long term (current) drug therapy: Secondary | ICD-10-CM | POA: Diagnosis not present

## 2016-05-05 DIAGNOSIS — Z0000 Encounter for general adult medical examination without abnormal findings: Secondary | ICD-10-CM | POA: Diagnosis not present

## 2016-06-30 DIAGNOSIS — E785 Hyperlipidemia, unspecified: Secondary | ICD-10-CM | POA: Diagnosis not present

## 2016-06-30 DIAGNOSIS — G4700 Insomnia, unspecified: Secondary | ICD-10-CM | POA: Diagnosis not present

## 2016-06-30 DIAGNOSIS — M179 Osteoarthritis of knee, unspecified: Secondary | ICD-10-CM | POA: Diagnosis not present

## 2016-06-30 DIAGNOSIS — I10 Essential (primary) hypertension: Secondary | ICD-10-CM | POA: Diagnosis not present

## 2016-06-30 DIAGNOSIS — Z0000 Encounter for general adult medical examination without abnormal findings: Secondary | ICD-10-CM | POA: Diagnosis not present

## 2017-08-09 DIAGNOSIS — I10 Essential (primary) hypertension: Secondary | ICD-10-CM | POA: Diagnosis not present

## 2017-08-09 DIAGNOSIS — Z125 Encounter for screening for malignant neoplasm of prostate: Secondary | ICD-10-CM | POA: Diagnosis not present

## 2017-08-09 DIAGNOSIS — E785 Hyperlipidemia, unspecified: Secondary | ICD-10-CM | POA: Diagnosis not present

## 2017-08-09 DIAGNOSIS — Z79899 Other long term (current) drug therapy: Secondary | ICD-10-CM | POA: Diagnosis not present

## 2017-08-14 DIAGNOSIS — E785 Hyperlipidemia, unspecified: Secondary | ICD-10-CM | POA: Diagnosis not present

## 2017-08-14 DIAGNOSIS — Z0000 Encounter for general adult medical examination without abnormal findings: Secondary | ICD-10-CM | POA: Diagnosis not present

## 2017-08-14 DIAGNOSIS — I10 Essential (primary) hypertension: Secondary | ICD-10-CM | POA: Diagnosis not present

## 2017-08-14 DIAGNOSIS — G4700 Insomnia, unspecified: Secondary | ICD-10-CM | POA: Diagnosis not present

## 2017-08-14 DIAGNOSIS — M179 Osteoarthritis of knee, unspecified: Secondary | ICD-10-CM | POA: Diagnosis not present

## 2017-10-29 DIAGNOSIS — M25512 Pain in left shoulder: Secondary | ICD-10-CM | POA: Diagnosis not present

## 2017-11-12 DIAGNOSIS — Z1211 Encounter for screening for malignant neoplasm of colon: Secondary | ICD-10-CM | POA: Diagnosis not present

## 2017-11-12 DIAGNOSIS — K635 Polyp of colon: Secondary | ICD-10-CM | POA: Diagnosis not present

## 2017-11-12 DIAGNOSIS — K5730 Diverticulosis of large intestine without perforation or abscess without bleeding: Secondary | ICD-10-CM | POA: Diagnosis not present

## 2017-11-12 DIAGNOSIS — Z8371 Family history of colonic polyps: Secondary | ICD-10-CM | POA: Diagnosis not present

## 2017-11-14 DIAGNOSIS — S76211D Strain of adductor muscle, fascia and tendon of right thigh, subsequent encounter: Secondary | ICD-10-CM | POA: Diagnosis not present

## 2017-11-15 DIAGNOSIS — K635 Polyp of colon: Secondary | ICD-10-CM | POA: Diagnosis not present

## 2017-12-13 ENCOUNTER — Other Ambulatory Visit: Payer: BLUE CROSS/BLUE SHIELD

## 2017-12-17 DIAGNOSIS — M25512 Pain in left shoulder: Secondary | ICD-10-CM | POA: Diagnosis not present

## 2018-04-10 DIAGNOSIS — M25512 Pain in left shoulder: Secondary | ICD-10-CM | POA: Diagnosis not present

## 2018-04-15 DIAGNOSIS — M25511 Pain in right shoulder: Secondary | ICD-10-CM | POA: Diagnosis not present

## 2018-07-17 DIAGNOSIS — M65841 Other synovitis and tenosynovitis, right hand: Secondary | ICD-10-CM | POA: Diagnosis not present

## 2018-07-17 DIAGNOSIS — M65842 Other synovitis and tenosynovitis, left hand: Secondary | ICD-10-CM | POA: Diagnosis not present

## 2018-07-17 DIAGNOSIS — M79644 Pain in right finger(s): Secondary | ICD-10-CM | POA: Diagnosis not present

## 2018-09-04 DIAGNOSIS — M79644 Pain in right finger(s): Secondary | ICD-10-CM | POA: Diagnosis not present

## 2018-09-04 DIAGNOSIS — D1801 Hemangioma of skin and subcutaneous tissue: Secondary | ICD-10-CM | POA: Diagnosis not present

## 2018-09-04 DIAGNOSIS — M65841 Other synovitis and tenosynovitis, right hand: Secondary | ICD-10-CM | POA: Diagnosis not present

## 2018-09-04 DIAGNOSIS — L738 Other specified follicular disorders: Secondary | ICD-10-CM | POA: Diagnosis not present

## 2018-09-04 DIAGNOSIS — L821 Other seborrheic keratosis: Secondary | ICD-10-CM | POA: Diagnosis not present

## 2018-09-04 DIAGNOSIS — L918 Other hypertrophic disorders of the skin: Secondary | ICD-10-CM | POA: Diagnosis not present

## 2018-09-04 DIAGNOSIS — M65842 Other synovitis and tenosynovitis, left hand: Secondary | ICD-10-CM | POA: Diagnosis not present

## 2018-09-04 DIAGNOSIS — L570 Actinic keratosis: Secondary | ICD-10-CM | POA: Diagnosis not present

## 2018-09-17 DIAGNOSIS — E785 Hyperlipidemia, unspecified: Secondary | ICD-10-CM | POA: Diagnosis not present

## 2018-09-17 DIAGNOSIS — Z125 Encounter for screening for malignant neoplasm of prostate: Secondary | ICD-10-CM | POA: Diagnosis not present

## 2018-09-17 DIAGNOSIS — I10 Essential (primary) hypertension: Secondary | ICD-10-CM | POA: Diagnosis not present

## 2018-09-20 DIAGNOSIS — Z0000 Encounter for general adult medical examination without abnormal findings: Secondary | ICD-10-CM | POA: Diagnosis not present

## 2018-09-20 DIAGNOSIS — E669 Obesity, unspecified: Secondary | ICD-10-CM | POA: Diagnosis not present

## 2018-09-20 DIAGNOSIS — I10 Essential (primary) hypertension: Secondary | ICD-10-CM | POA: Diagnosis not present

## 2018-09-20 DIAGNOSIS — E785 Hyperlipidemia, unspecified: Secondary | ICD-10-CM | POA: Diagnosis not present

## 2019-07-24 DIAGNOSIS — Z23 Encounter for immunization: Secondary | ICD-10-CM | POA: Diagnosis not present

## 2019-09-01 ENCOUNTER — Other Ambulatory Visit: Payer: Self-pay

## 2019-09-01 DIAGNOSIS — Z20822 Contact with and (suspected) exposure to covid-19: Secondary | ICD-10-CM

## 2019-09-03 LAB — NOVEL CORONAVIRUS, NAA: SARS-CoV-2, NAA: NOT DETECTED

## 2019-12-11 DIAGNOSIS — Z125 Encounter for screening for malignant neoplasm of prostate: Secondary | ICD-10-CM | POA: Diagnosis not present

## 2019-12-11 DIAGNOSIS — I10 Essential (primary) hypertension: Secondary | ICD-10-CM | POA: Diagnosis not present

## 2019-12-11 DIAGNOSIS — Z0000 Encounter for general adult medical examination without abnormal findings: Secondary | ICD-10-CM | POA: Diagnosis not present

## 2019-12-11 DIAGNOSIS — E785 Hyperlipidemia, unspecified: Secondary | ICD-10-CM | POA: Diagnosis not present

## 2019-12-11 DIAGNOSIS — Z79899 Other long term (current) drug therapy: Secondary | ICD-10-CM | POA: Diagnosis not present

## 2019-12-11 DIAGNOSIS — E669 Obesity, unspecified: Secondary | ICD-10-CM | POA: Diagnosis not present

## 2020-01-30 DIAGNOSIS — L814 Other melanin hyperpigmentation: Secondary | ICD-10-CM | POA: Diagnosis not present

## 2020-08-18 DIAGNOSIS — M25561 Pain in right knee: Secondary | ICD-10-CM | POA: Insufficient documentation

## 2021-01-31 ENCOUNTER — Other Ambulatory Visit: Payer: Self-pay | Admitting: Internal Medicine

## 2021-01-31 DIAGNOSIS — E785 Hyperlipidemia, unspecified: Secondary | ICD-10-CM

## 2021-02-21 ENCOUNTER — Ambulatory Visit
Admission: RE | Admit: 2021-02-21 | Discharge: 2021-02-21 | Disposition: A | Discharge status: Home / Self Care | Payer: No Typology Code available for payment source | Source: Ambulatory Visit | Attending: Internal Medicine | Admitting: Internal Medicine

## 2021-02-21 DIAGNOSIS — E785 Hyperlipidemia, unspecified: Secondary | ICD-10-CM

## 2021-03-17 ENCOUNTER — Other Ambulatory Visit: Payer: Self-pay

## 2021-03-17 ENCOUNTER — Institutional Professional Consult (permissible substitution) (INDEPENDENT_AMBULATORY_CARE_PROVIDER_SITE_OTHER): Payer: 59 | Admitting: Cardiothoracic Surgery

## 2021-03-17 VITALS — BP 130/88 | HR 65 | Resp 20 | Ht 72.0 in | Wt 245.0 lb

## 2021-03-17 DIAGNOSIS — I712 Thoracic aortic aneurysm, without rupture: Secondary | ICD-10-CM | POA: Diagnosis not present

## 2021-03-17 DIAGNOSIS — I7121 Aneurysm of the ascending aorta, without rupture: Secondary | ICD-10-CM

## 2021-03-18 NOTE — Progress Notes (Signed)
[address]       [place] [address]             [phone]     CARDIOTHORACIC SURGERY CONSULTATION REPORT  Referring Provider is Marden Noble, MD Primary Cardiologist is None PCP is Shaune Pollack, MD (Inactive)  Chief Complaint  Patient presents with  . Thoracic Aortic Aneurysm    Surgical consult, Cardiac CT 02/21/21    HPI:  60 year old man is referred for evaluation of ascending aortic aneurysm.  Patient is healthy and is in his usual state of health until last month when he requested a cardiac calcification on CT scan.  This did not demonstrate significant coronary calcification but did show an incidentally noted 4.5 cm ascending aortic aneurysm.  He denies any personal history of aneurysm.  He has no significant family history of aneurysm.  He denies any significant chest pain or back pain.  He has been treated for hypertension for several years  Past Medical History:  Diagnosis Date  . Hypertension     Past Surgical History:  Procedure Laterality Date  . sebacceous cyst of knee      Family History  Problem Relation Age of Onset  . Hypertension Other     Social History   Socioeconomic History  . Marital status: Married    Spouse name: Not on file  . Number of children: Not on file  . Years of education: Not on file  . Highest education level: Not on file  Occupational History  . Not on file  Tobacco Use  . Smoking status: Never Smoker  . Smokeless tobacco: Not on file  Substance and Sexual Activity  . Alcohol use: Yes  . Drug use: No  . Sexual activity: Not on file  Other Topics Concern  . Not on file  Social History Narrative  . Not on file   Social Determinants of Health   Financial Resource Strain: Not on file  Food Insecurity: Not on file  Transportation Needs: Not on file  Physical Activity: Not on file  Stress: Not on file  Social Connections: Not on file  Intimate Partner Violence: Not on file    Current  Outpatient Medications  Medication Sig Dispense Refill  . amLODipine (NORVASC) 2.5 MG tablet TAKE 1 TABLET DAILY (NEED OFFICE VISIT BEFORE ANY MORE REFILLS, LAST OFFICE VISIT WAS 05/2012) 30 tablet 0  . aspirin 81 MG tablet Take 81 mg by mouth daily.    Marland Kitchen atenolol (TENORMIN) 50 MG tablet TAKE 1 TABLET DAILY (Patient taking differently: Take 25 mg by mouth daily.) 90 tablet 2  . atorvastatin (LIPITOR) 10 MG tablet TAKE 1 TABLET DAILY 90 tablet 1  . glucosamine-chondroitin 500-400 MG tablet Take 1 tablet by mouth daily.    . Omega-3 Fatty Acids (FISH OIL) 500 MG CAPS Take 500 capsules by mouth.    . telmisartan-hydrochlorothiazide (MICARDIS HCT) 80-12.5 MG per tablet Take 1 tablet by mouth daily. 90 tablet 3  . Turmeric (QC TUMERIC COMPLEX) 500 MG CAPS Take by mouth daily.    Marland Kitchen zolpidem (AMBIEN) 10 MG tablet Take 1 tablet (10 mg total) by mouth at bedtime as needed. 30 tablet 0  . niacin 250 MG CR capsule Take 250 mg by mouth 2 (two) times daily.   (Patient not taking: Reported on 03/17/2021)     No current facility-administered medications for this visit.    Allergies  Allergen Reactions  . No Known Allergies  Review of Systems:   General:  Pound weight gain over the last couple of years; no change in energy level  Cardiac:  No chest pain or shortness of breath  Respiratory:  Denies cough asthma uses CPAP  GI:   Last colonoscopy 2 and half years ago  GU:   Negative  Vascular:  No claudication or venous disease  Neuro:   Denies strokes or TIA's  Musculoskeletal: Endorses arthritis and muscle pain  Skin:   Negative  Psych:   Negative  Eyes:   Negative  ENT:   Some mild hearing loss; last saw dentist May '22  Hematologic:  Negative  Endocrine:  No diabetes,      Physical Exam:   BP 130/88   Pulse 65   Resp 20   Ht 6' (1.829 m)   Wt 111.1 kg   SpO2 96% Comment: RA  BMI 33.23 kg/m   General:  well-appearing  HEENT:  Unremarkable   Neck:   no JVD, no bruits, no  adenopathy   Chest:   clear to auscultation, symmetrical breath sounds, no wheezes, no rhonchi   CV:   RRR, no detectable murmur   Abdomen:  soft, non-tender, no masses   Extremities:  warm, well-perfused, pulses intact, no LE edema  Rectal/GU  Deferred  Neuro:   Grossly non-focal and symmetrical throughout  Skin:   Clean and dry, no rashes, no breakdown   Diagnostic Tests:  Have personally reviewed the CT scan from 922 which demonstrates a sub-5 cm ascending aortic aneurysm.   Impression:  Otherwise healthy 60 year old man who has an incidentally discovered aneurysm.  He is already treated for hypertension.  This can safely be watched for now   Plan: Follow-up in 1 year with repeat CT chest Continue strict blood pressure control No specific physical limitations   I spent in excess of 30 minutes during the conduct of this office consultation and >50% of this time involved direct face-to-face encounter with the patient for counseling and/or coordination of their care.          Level 3 Office Consult = 40 minutes         Level 4 Office Consult = 60 minutes         Level 5 Office Consult = 80 minutes  B.  Lorayne Marek, MD 03/18/2021 1:36 PM

## 2021-03-24 ENCOUNTER — Encounter: Payer: No Typology Code available for payment source | Admitting: Cardiothoracic Surgery

## 2022-01-19 ENCOUNTER — Other Ambulatory Visit: Payer: Self-pay | Admitting: Surgery

## 2022-01-19 DIAGNOSIS — I7121 Aneurysm of the ascending aorta, without rupture: Secondary | ICD-10-CM

## 2022-03-01 ENCOUNTER — Ambulatory Visit: Payer: 59

## 2022-03-01 ENCOUNTER — Inpatient Hospital Stay: Admission: RE | Admit: 2022-03-01 | Payer: 59 | Source: Ambulatory Visit

## 2022-03-02 ENCOUNTER — Ambulatory Visit
Admission: RE | Admit: 2022-03-02 | Discharge: 2022-03-02 | Disposition: A | Discharge status: Home / Self Care | Payer: 59 | Source: Ambulatory Visit | Attending: Surgery | Admitting: Surgery

## 2022-03-02 DIAGNOSIS — I7121 Aneurysm of the ascending aorta, without rupture: Secondary | ICD-10-CM

## 2022-03-06 ENCOUNTER — Encounter: Payer: Self-pay | Admitting: Cardiothoracic Surgery

## 2022-03-06 ENCOUNTER — Ambulatory Visit: Payer: 59 | Admitting: Cardiothoracic Surgery

## 2022-03-06 VITALS — BP 126/89 | HR 77 | Resp 20 | Wt 257.0 lb

## 2022-03-06 DIAGNOSIS — I712 Thoracic aortic aneurysm, without rupture, unspecified: Secondary | ICD-10-CM | POA: Insufficient documentation

## 2022-03-06 DIAGNOSIS — I7121 Aneurysm of the ascending aorta, without rupture: Secondary | ICD-10-CM | POA: Diagnosis not present

## 2022-03-06 NOTE — Progress Notes (Signed)
HPI: 61 year old healthy non-smoker with well-controlled hypertension presents for annual CT chest to evaluate known asymptomatic mild 4.2 cm ascending aneurysm.  This was noted as an incidental finding on a cardiac scan for calcium score in 2022.  Patient remains asymptomatic.  His blood pressure is well controlled on Norvasc.  His calcium score last year was 185 and his primary physician titrated his Lipitor to 10 mg daily.  Patient stays active and walks 2 to 3 miles every other day with his wife.  Current Outpatient Medications  Medication Sig Dispense Refill   amLODipine (NORVASC) 2.5 MG tablet TAKE 1 TABLET DAILY (NEED OFFICE VISIT BEFORE ANY MORE REFILLS, LAST OFFICE VISIT WAS 05/2012) 30 tablet 0   aspirin 81 MG tablet Take 81 mg by mouth daily.     atenolol (TENORMIN) 50 MG tablet TAKE 1 TABLET DAILY (Patient taking differently: Take 25 mg by mouth daily.) 90 tablet 2   atorvastatin (LIPITOR) 10 MG tablet TAKE 1 TABLET DAILY 90 tablet 1   glucosamine-chondroitin 500-400 MG tablet Take 1 tablet by mouth daily.     Omega-3 Fatty Acids (FISH OIL) 500 MG CAPS Take 500 capsules by mouth.     telmisartan-hydrochlorothiazide (MICARDIS HCT) 80-12.5 MG per tablet Take 1 tablet by mouth daily. 90 tablet 3   Turmeric 500 MG CAPS Take by mouth daily.     zolpidem (AMBIEN) 10 MG tablet Take 1 tablet (10 mg total) by mouth at bedtime as needed. (Patient not taking: Reported on 03/06/2022) 30 tablet 0   No current facility-administered medications for this visit.   Blood pressure 126/89, pulse 77, resp. rate 20, weight 257 lb (116.6 kg), SpO2 97 %.   Physical Exam:      Exam    General- alert and comfortable    Neck- no JVD, no cervical adenopathy palpable, no carotid bruit   Lungs- clear without rales, wheezes   Cor- regular rate and rhythm, no murmur , gallop   Abdomen- soft, non-tender   Extremities - warm, non-tender, minimal edema   Neuro- oriented, appropriate, no focal  weakness   Diagnostic Tests: Images of thoracic CTA personally reviewed. The ascending aorta is mildly elevated and remained stable at 4.2 cm. There is no evidence of intramural hematoma or intimal ulceration.  Lung windows show no pleural effusion or significant adenopathy.  Impression: Stable asymptomatic 4.2 cm fusiform ascending aneurysm.  Continue annual surveillance CT scan The patient is at very low risk for aortic tear/dissection and the best approaches continue blood pressure monitoring and control to keep systolic blood pressure less than 140.  Hypertension well-controlled.  Patient will get a digital blood pressure device and keep a record to help his primary physician with management of blood pressure.  Plan: Return in 1 year for surveillance CT scan of the chest without contrast to follow the mild ascending aneurysm first noted as an incidental finding in 2022.    Dahlia Byes, MD [facility] [phone]

## 2022-12-02 IMAGING — CT CT CARDIAC CORONARY ARTERY CALCIUM SCORE
3 series · 14 of 20 positions shown, 16 images · non-contrast
Comparison: None.

CLINICAL DATA: Hypertension, family history

EXAM:
CT CARDIAC CORONARY ARTERY CALCIUM SCORE
TECHNIQUE: Non-contrast imaging through the heart was performed using
prospective ECG gating. Image post processing was performed on an
independent workstation, allowing for quantitative analysis of the
heart and coronary arteries. Note that this exam targets the heart
and the chest was not imaged in its entirety.

[Series 2: calcium scoring 2.00 qr36 bestdiast 70% hrt calciu · axial · 0.45mm/px · z∈[+1893,+1977]mm · 4 of 70 slices shown]
[im 14/70  vessel]
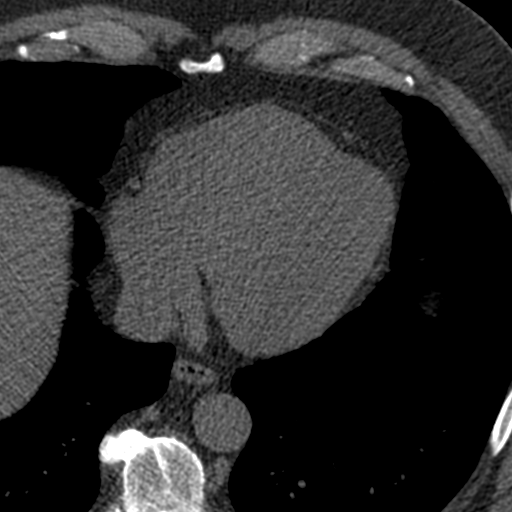
[im 28/70  vessel]
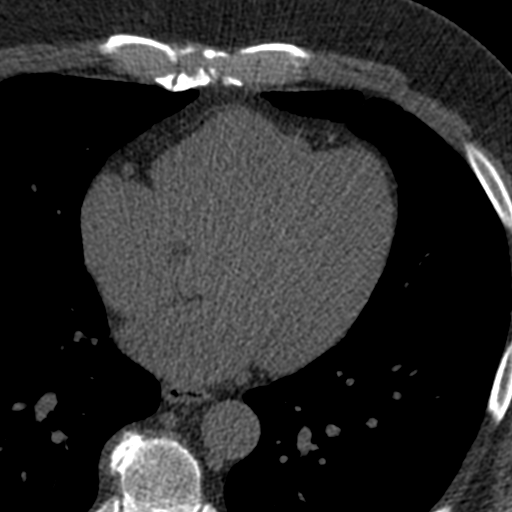
[im 42/70  vessel]
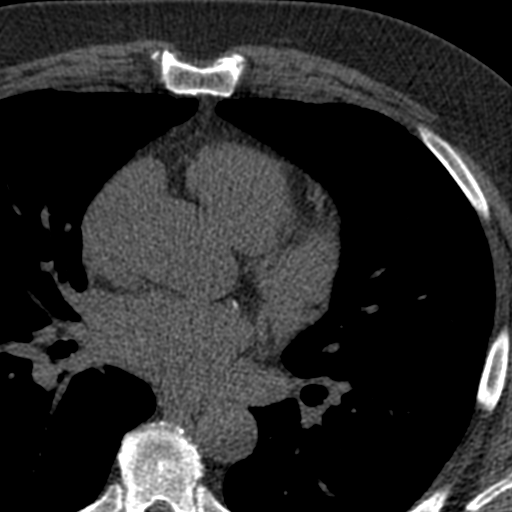
[im 56/70  vessel]
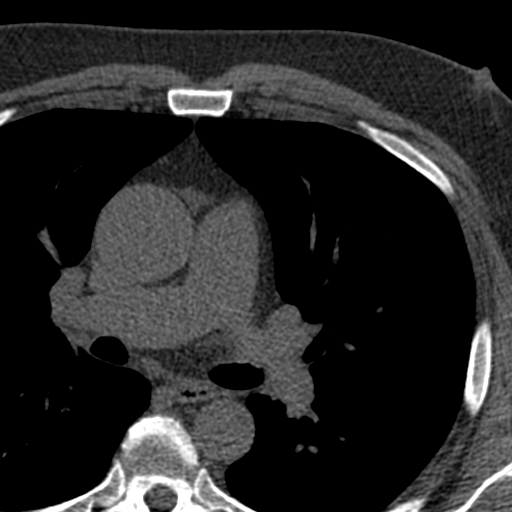

[Series 3: calcium scoring 2.00 br40 bestdiast 70% axial · axial · 0.68mm/px · z∈[+1889,+1981]mm · 5 of 70 slices shown, 7 images]
[im 12/70  vessel]
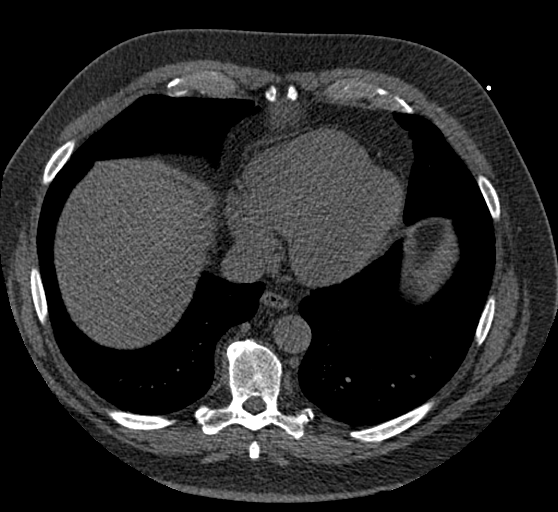
[im 12/70  lung]
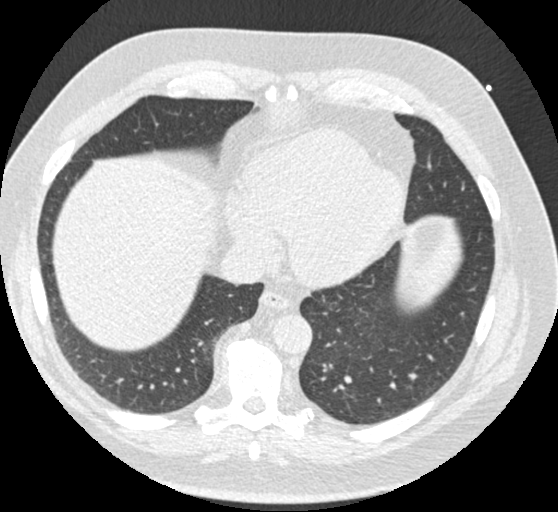
[im 24/70  vessel]
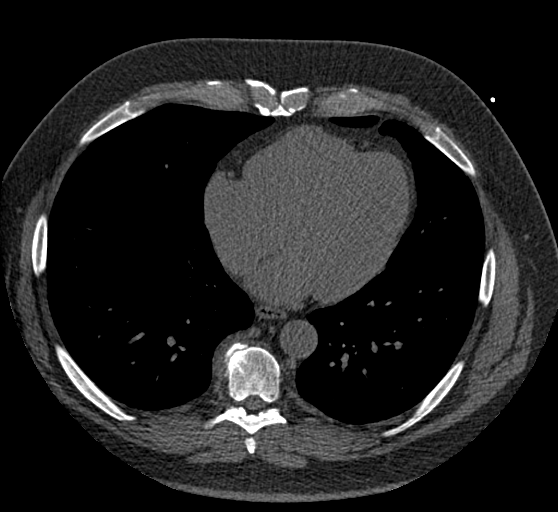
[im 35/70  vessel]
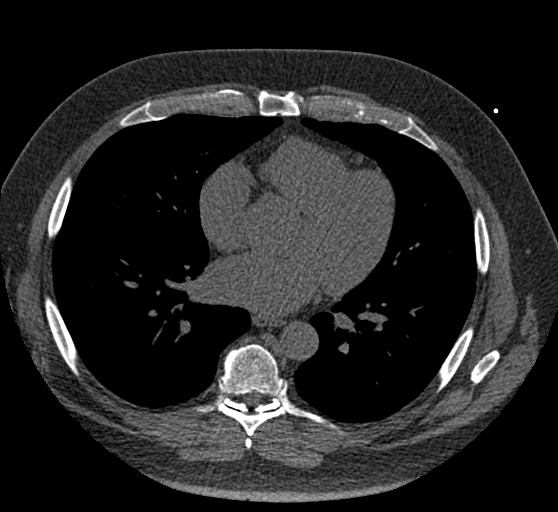
[im 47/70  vessel]
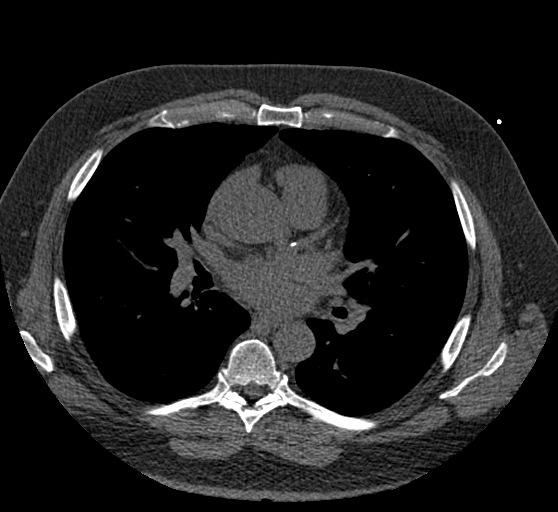
[im 58/70  vessel]
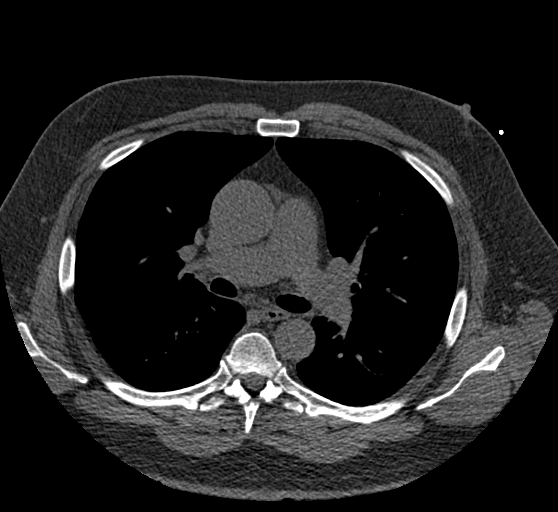
[im 58/70  lung]
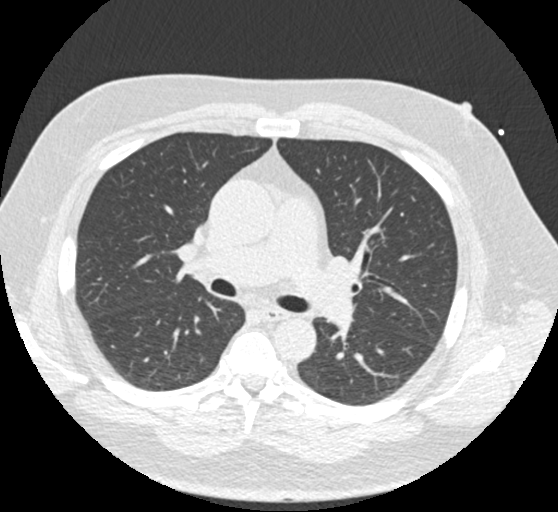

[Series 9: calcium scoring 2.00 br60 bestdiast 70% lungs · axial · 0.70mm/px · z∈[+1889,+1981]mm · 5 of 70 slices shown]
[im 12/70  vessel]
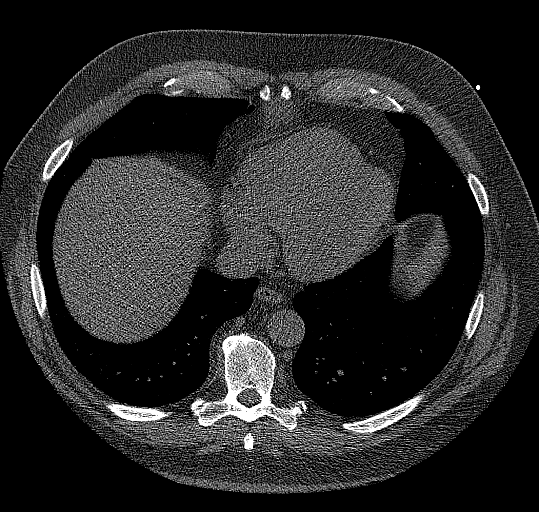
[im 24/70  vessel]
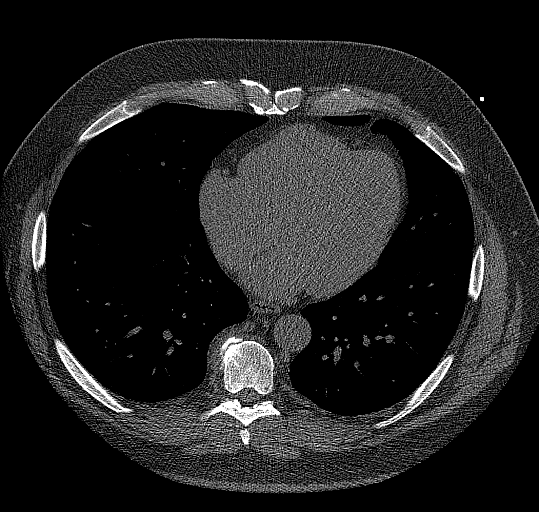
[im 35/70  vessel]
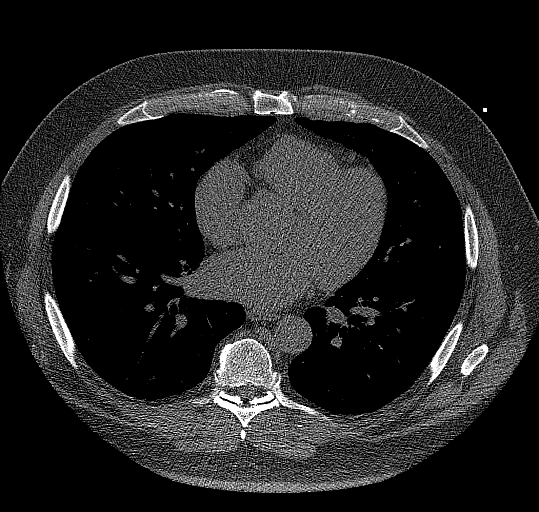
[im 47/70  vessel]
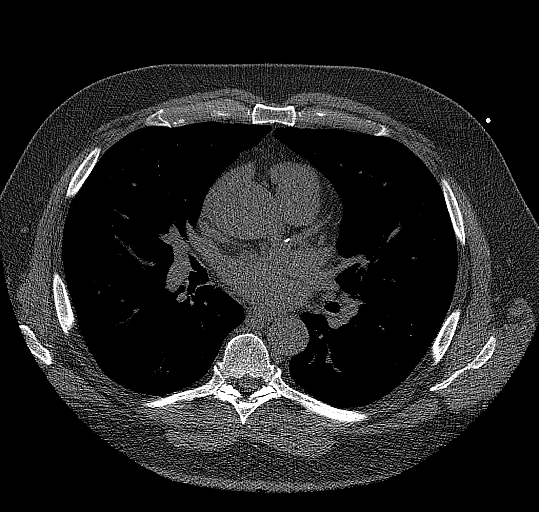
[im 58/70  vessel]
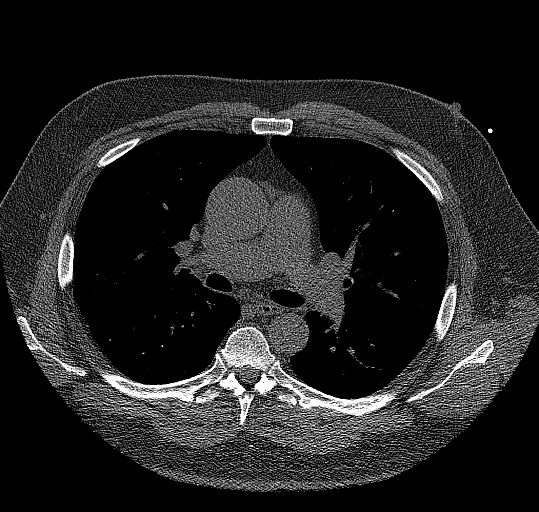

[14 of 20 positions shown; findings below may reference images not displayed]

FINDINGS: CORONARY CALCIUM SCORES:

Left Main: 0

LAD:

LCx:

RCA:

Total Agatston Score: 185

[HOSPITAL] percentile: 75

AORTA MEASUREMENTS:

Ascending Aorta: 43 mm

Descending Aorta: 27 mm

OTHER FINDINGS:

Heart is normal size. Aneurysmal dilatation of the ascending
thoracic aorta. Scattered aortic calcifications in the arch. No
adenopathy. No confluent opacities or effusions. Imaging into the
upper abdomen demonstrates no acute findings. Chest wall soft
tissues are unremarkable. No acute bony abnormality.
IMPRESSION: Total Agatston score: 185

[HOSPITAL] percentile: 75

4.3 cm ascending thoracic aortic aneurysm. Recommend annual imaging
followup by CTA or MRA. This recommendation follows 6474
ACCF/AHA/AATS/ACR/ASA/SCA/NAMIJI/FOKIN/VALLECILLA/DEEQA RAYAAN Guidelines for the
Diagnosis and Management of Patients with Thoracic Aortic Disease.
Circulation. 6474; 121: E266-e369. Aortic aneurysm NOS (J1QQN-RMU.L)

## 2023-01-24 ENCOUNTER — Other Ambulatory Visit: Payer: Self-pay | Admitting: Cardiothoracic Surgery

## 2023-01-24 DIAGNOSIS — I7121 Aneurysm of the ascending aorta, without rupture: Secondary | ICD-10-CM

## 2023-03-07 NOTE — Progress Notes (Signed)
[address]       [place] [address]             [phone]        Kyle Mcdaniel [id number] 05/04/1961  History of Present Illness:  Kyle Mcdaniel is known to [facility].  He has a history of HTN and an Ascending Aortic Aneurysm measuring 4.2 cm at his last visit with Dr. Donata Clay in 2023.  He presents today for 1 year surveillance follow up.  Overall patient is doing well.  He denies chest pain and shortness of breath.  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  Does experience joint pain.  He is a non smoker, compliant with medications.  Has BP cuff at home per recommendations from PCP.     Current Outpatient Medications on File Prior to Visit  Medication Sig Dispense Refill   amLODipine (NORVASC) 2.5 MG tablet TAKE 1 TABLET DAILY (NEED OFFICE VISIT BEFORE ANY MORE REFILLS, LAST OFFICE VISIT WAS 05/2012) 30 tablet 0   aspirin 81 MG tablet Take 81 mg by mouth daily.     atenolol (TENORMIN) 50 MG tablet TAKE 1 TABLET DAILY (Patient taking differently: Take 25 mg by mouth daily.) 90 tablet 2   atorvastatin (LIPITOR) 10 MG tablet TAKE 1 TABLET DAILY 90 tablet 1   gabapentin (NEURONTIN) 100 MG capsule Take 1 capsule by mouth 3 (three) times daily.     glucosamine-chondroitin 500-400 MG tablet Take 1 tablet by mouth daily.     Omega-3 Fatty Acids (FISH OIL) 500 MG CAPS Take 500 capsules by mouth.     telmisartan-hydrochlorothiazide (MICARDIS HCT) 80-12.5 MG per tablet Take 1 tablet by mouth daily. 90 tablet 3   Turmeric 500 MG CAPS Take by mouth daily.     zolpidem (AMBIEN) 10 MG tablet Take 1 tablet (10 mg total) by mouth at bedtime as needed. (Patient not taking: Reported on 03/13/2023) 30 tablet 0   No current facility-administered medications on file prior to visit.     BP (!) 134/92 (BP Location: Left Arm, Patient Position: Sitting)   Pulse 80   Resp 18   Ht 6' (1.829 m)   Wt 256 lb (116.1 kg)   SpO2 96% Comment: RA  BMI 34.72 kg/m   Physical  Exam  Gen: NAD Heart: RRR Lungs: CTA bilaterally Neck: no bruit present Ext: no edema Neuro: grossly intact  CTA Results:   Narrative & Impression  CLINICAL DATA:  Thoracic aortic aneurysm.   EXAM: CT CHEST WITHOUT CONTRAST   TECHNIQUE: Multidetector CT imaging of the chest was performed following the standard protocol without IV contrast.   RADIATION DOSE REDUCTION: This exam was performed according to the departmental dose-optimization program which includes automated exposure control, adjustment of the mA and/or kV according to patient size and/or use of iterative reconstruction technique.   COMPARISON:  Chest CT dated 03/02/2022.   FINDINGS: Cardiovascular: Stable mild aneurysmal dilatation of the ascending thoracic aorta, measuring 4.3 cm diameter. Aortic arch and descending thoracic aorta are normal in caliber and configuration.   No clinically significant pericardial effusion. Scattered coronary artery calcifications, particularly prominent within the LEFT circumflex coronary artery.   Mediastinum/Nodes: No mass or enlarged lymph nodes are seen within the mediastinum or perihilar regions. Esophagus is unremarkable. Trachea and central bronchi are unremarkable.   Lungs/Pleura: Mild scarring/atelectasis within the lower lobes bilaterally. No suspicious-appearing pulmonary nodule or mass. No pleural effusion or pneumothorax.   Upper Abdomen:  Limited images of the upper abdomen are unremarkable.   Musculoskeletal: Mild degenerative spondylosis of the thoracic spine. No acute-appearing osseous abnormality.   IMPRESSION: 1. Stable mild aneurysmal dilatation of the ascending thoracic aorta, measuring 4.3 cm diameter. Recommend annual imaging followup by CTA or MRA. This recommendation follows 2010 ACCF/AHA/AATS/ACR/ASA/SCA/SCAI/SIR/STS/SVM Guidelines for the Diagnosis and Management of Patients with Thoracic Aortic Disease. Circulation. 2010; 121: Z610-R604.  Aortic aneurysm NOS (ICD10-I71.9) 2. Scattered coronary artery calcifications, particularly prominent within the LEFT circumflex coronary artery. Recommend correlation with any possible associated cardiac symptoms. 3. No acute findings. No pneumonia or pulmonary edema.     Electronically Signed   By: Bary Richard M.D.   On: 03/13/2023 13:38    A/P;  Stable Ascending Aortic Aneurysm measuring 4.3 cm HTN- well controlled, patient compliant on medications HLD- on Lipitor RTC In 1 year with CT chest w/o contrast  Risk Modification:  Statin:  Yes  Smoking cessation instruction/counseling given:  Non smoker  Patient was counseled on importance of Blood Pressure Control.  Despite Medical intervention if the patient notices persistently elevated blood pressure readings.  They are instructed to contact their Primary Care Physician  Please avoid use of Fluoroquinolones as this can potentially increase your risk of Aortic Rupture and/or Dissection  Patient educated on signs and symptoms of Aortic Dissection, handout also provided in AVS  Synia Douglass, PA-C 03/13/23

## 2023-03-13 ENCOUNTER — Ambulatory Visit
Admission: RE | Admit: 2023-03-13 | Discharge: 2023-03-13 | Disposition: A | Discharge status: Home / Self Care | Payer: 59 | Source: Ambulatory Visit | Attending: Cardiothoracic Surgery | Admitting: Cardiothoracic Surgery

## 2023-03-13 ENCOUNTER — Ambulatory Visit (INDEPENDENT_AMBULATORY_CARE_PROVIDER_SITE_OTHER): Payer: 59 | Admitting: Physician Assistant

## 2023-03-13 VITALS — BP 134/92 | HR 80 | Resp 18 | Ht 72.0 in | Wt 256.0 lb

## 2023-03-13 DIAGNOSIS — I7121 Aneurysm of the ascending aorta, without rupture: Secondary | ICD-10-CM

## 2023-12-11 IMAGING — CT CT CHEST W/O CM
1 of 2 series · 15 of 32 positions shown, 19 images · non-contrast
Comparison: February 21, 2021 cardiac evaluation.

CLINICAL DATA: A 60-year-old male presents for evaluation of aortic
aneurysm.



[Series 6: super d · axial · 0.84mm/px · z∈[+11,+272]mm · 15 of 366 slices shown, 19 images]
[im 20/366  mediastinal]
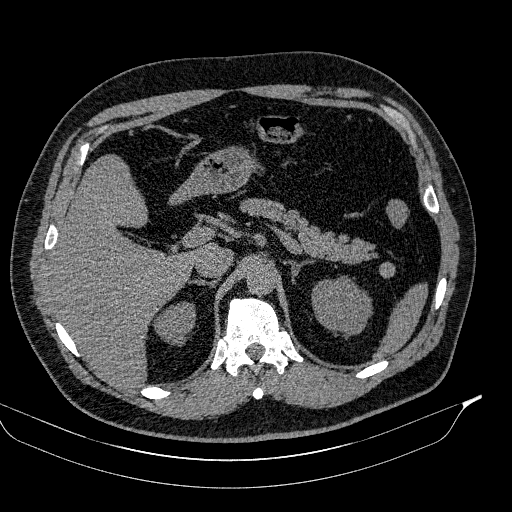
[im 20/366  lung]
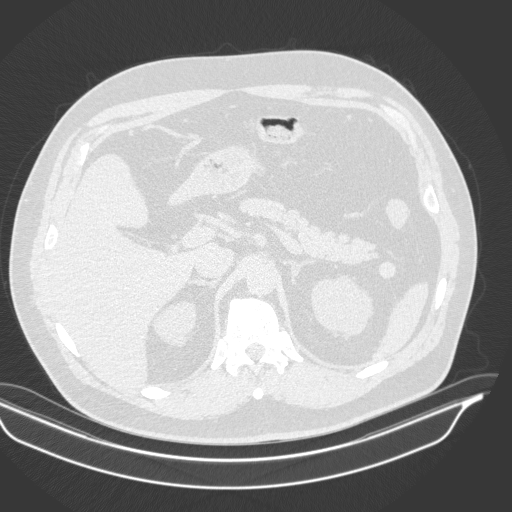
[im 58/366  lung]
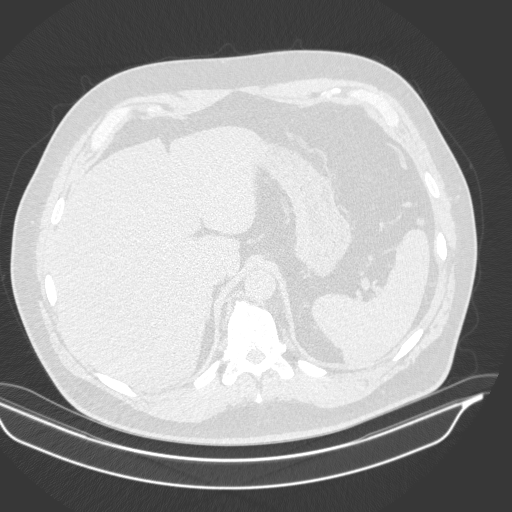
[im 77/366  lung]
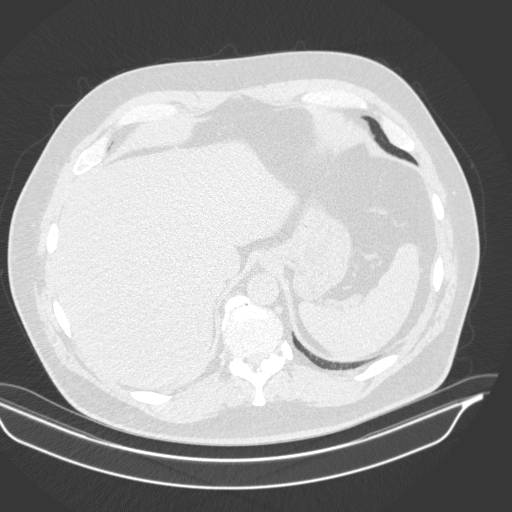
[im 97/366  lung]
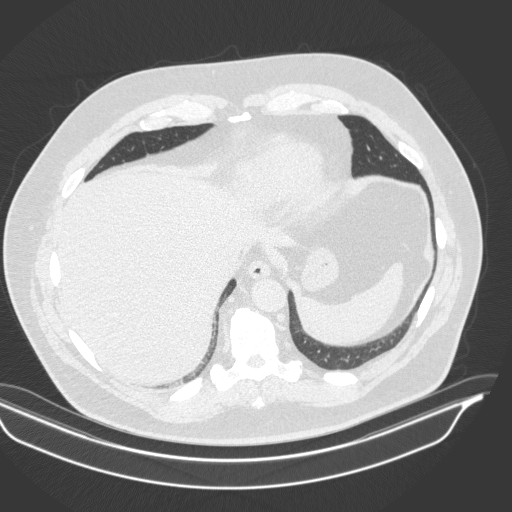
[im 122/366  mediastinal]
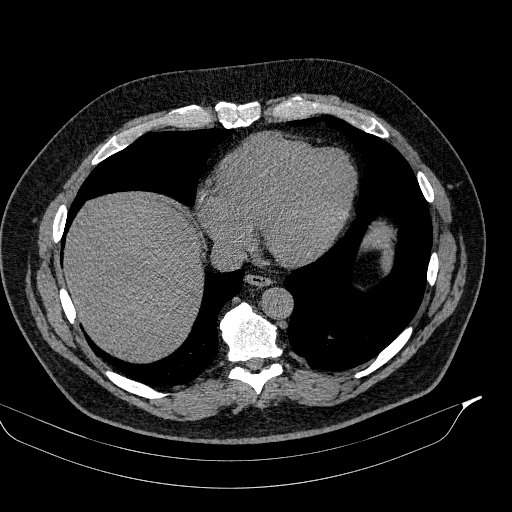
[im 122/366  lung]
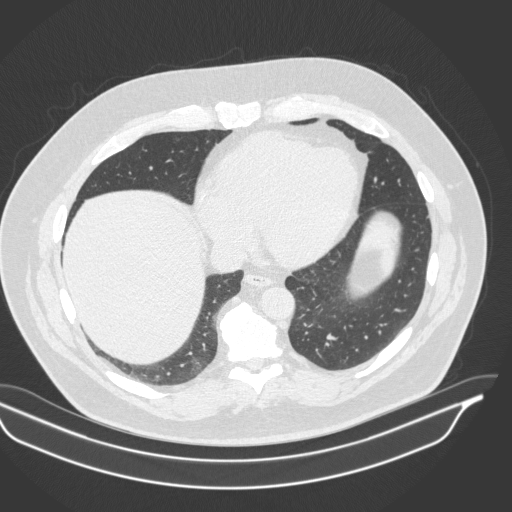
[im 135/366  lung]
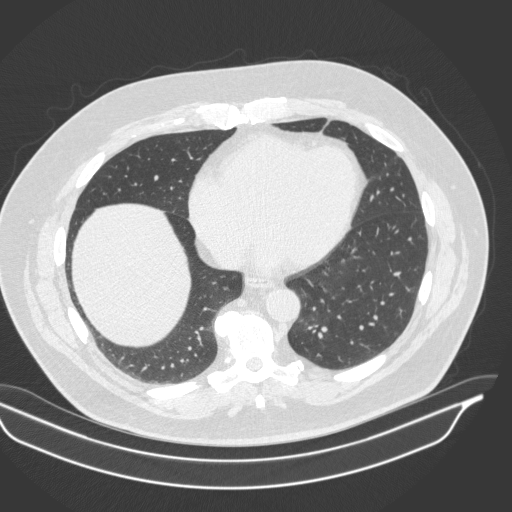
[im 171/366  lung]
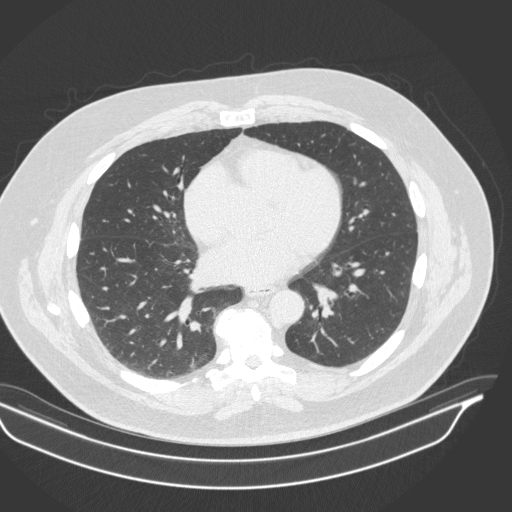
[im 173/366  lung]
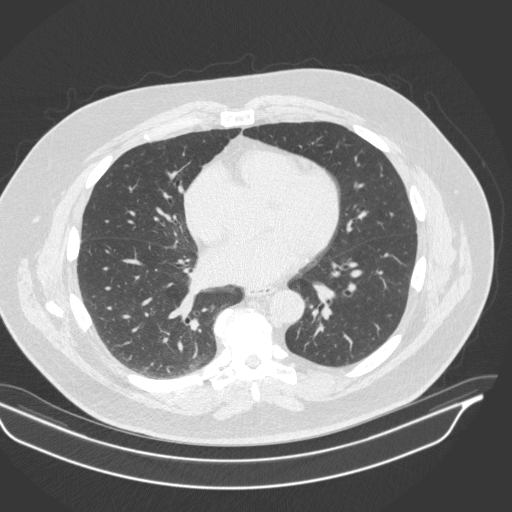
[im 193/366  mediastinal]
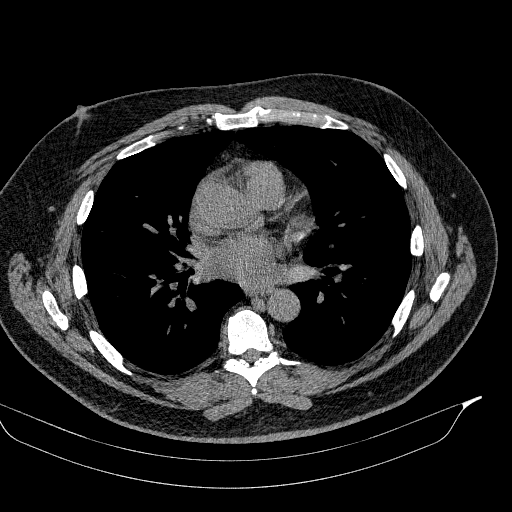
[im 193/366  lung]
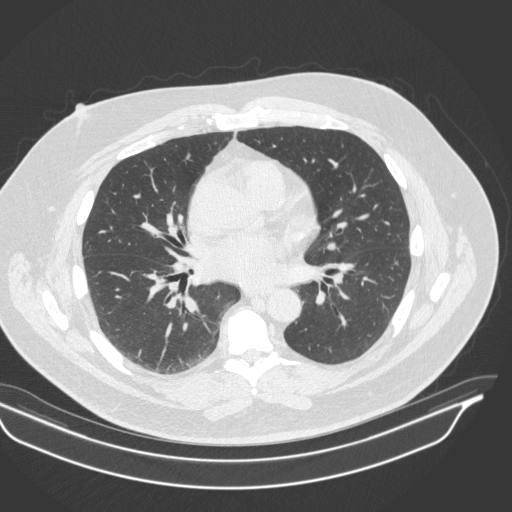
[im 231/366  lung]
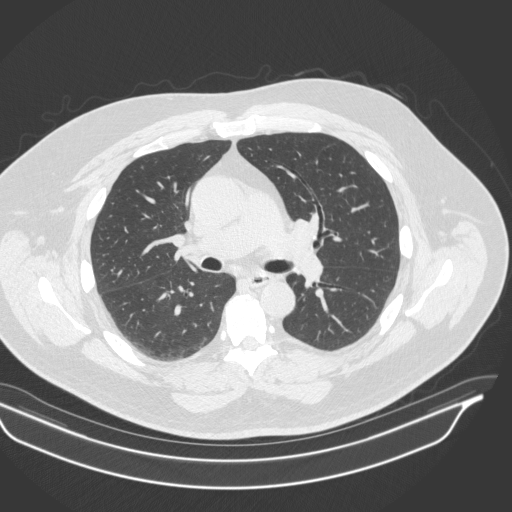
[im 244/366  lung]
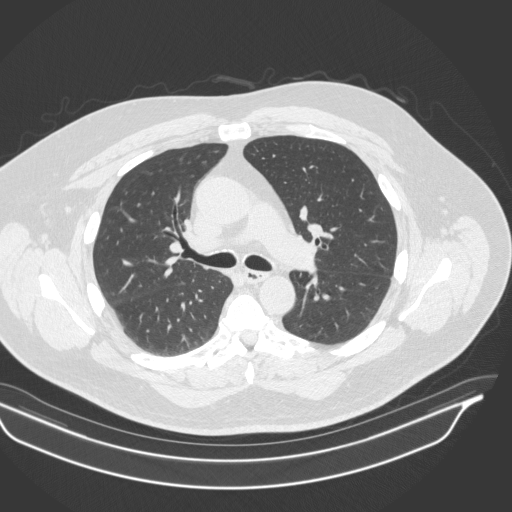
[im 269/366  lung]
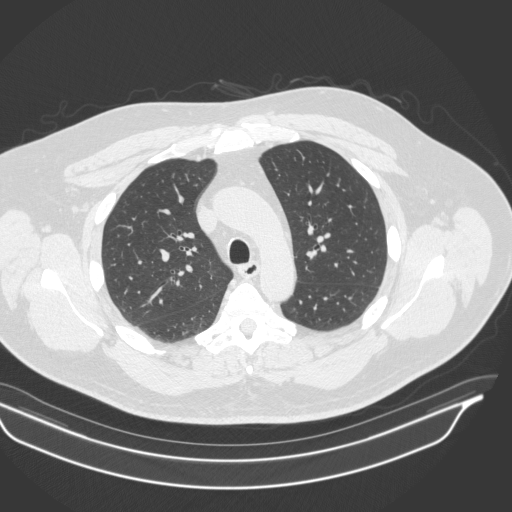
[im 289/366  mediastinal]
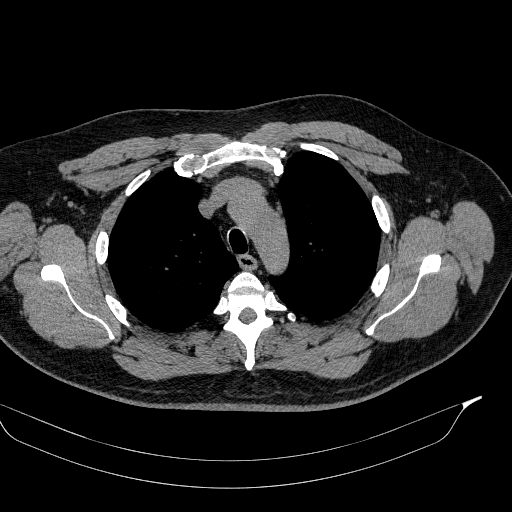
[im 289/366  lung]
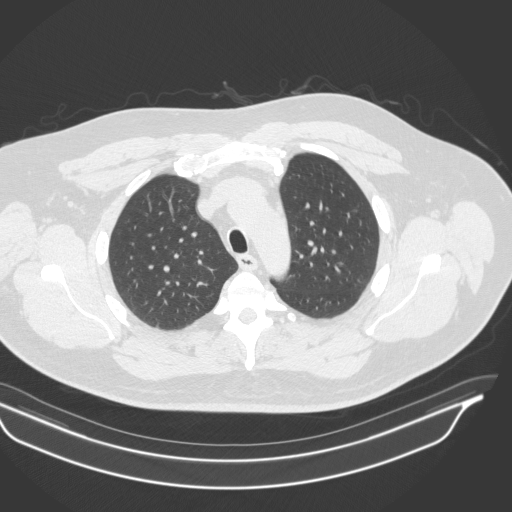
[im 308/366  lung]
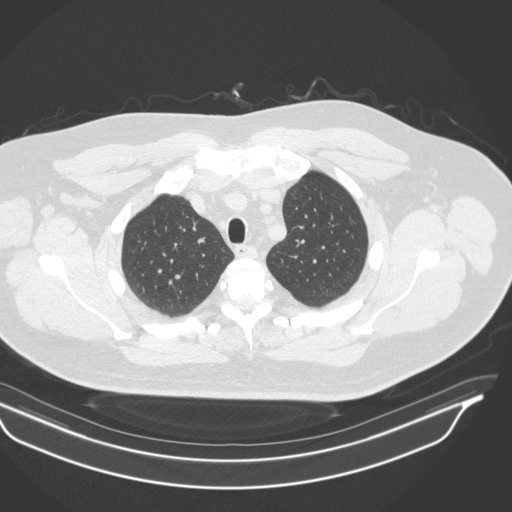
[im 346/366  lung]
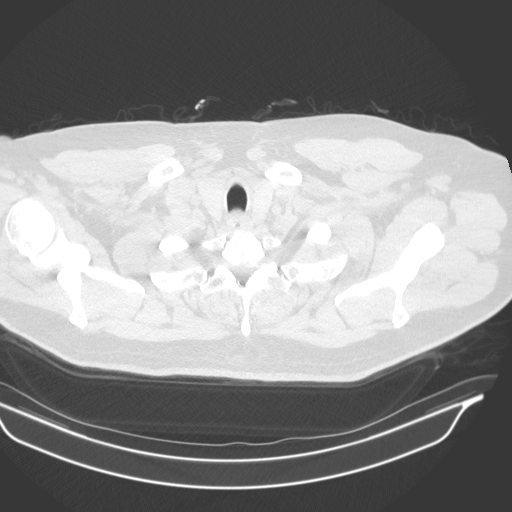

[15 of 32 positions shown; findings below may reference images not displayed]

FINDINGS: Cardiovascular: Ascending thoracic aorta stable at 4.2 cm greatest
axial dimension. Similar caliber when measured in the coronal plane
with normal caliber of the descending thoracic aorta. Scattered
aortic atherosclerotic calcifications. Normal heart size without
pericardial effusion or thickening. Normal caliber of central
pulmonary vessels. Limited assessment of cardiovascular structures
given lack of intravenous contrast.

Mediastinum/Nodes: No thoracic inlet, axillary, mediastinal or hilar
adenopathy. Esophagus grossly normal.

Lungs/Pleura: Lungs are clear. Airways are patent. No signs of
pleural effusion.

Upper Abdomen: Incidental imaging of upper abdominal contents shows
signs of moderate hepatic steatosis. No acute findings relative to
visualized liver, pancreas, spleen, adrenal glands or kidneys.
Limited assessment of the gastrointestinal tract is unremarkable.

Musculoskeletal: No acute bone finding. No destructive bone process.
Spinal degenerative changes.
IMPRESSION: 1. Ascending thoracic aorta stable at 4.2 cm greatest axial
dimension. This is stable compared to previous imaging from Saturday February, 2021. Recommend annual imaging followup by CTA or MRA. This
recommendation follows 1494
ACCF/AHA/AATS/ACR/ASA/SCA/JOURDAN/HAKKAOUI/IBETH/ALEQSANDRE Guidelines for the
Diagnosis and Management of Patients with Thoracic Aortic Disease.
Circulation. 1494; 121: E266-e369. Aortic aneurysm NOS (6ATQY-BQ6.L)
2. Moderate hepatic steatosis.
3. Aortic atherosclerosis.

Aortic Atherosclerosis (6ATQY-[id number].L).

## 2024-01-25 ENCOUNTER — Other Ambulatory Visit: Payer: Self-pay | Admitting: Surgery

## 2024-01-25 DIAGNOSIS — I7121 Aneurysm of the ascending aorta, without rupture: Secondary | ICD-10-CM

## 2024-02-19 ENCOUNTER — Other Ambulatory Visit

## 2024-02-20 ENCOUNTER — Ambulatory Visit
Admission: RE | Admit: 2024-02-20 | Discharge: 2024-02-20 | Disposition: A | Discharge status: Home / Self Care | Source: Ambulatory Visit | Attending: Surgery | Admitting: Surgery

## 2024-02-20 DIAGNOSIS — I7121 Aneurysm of the ascending aorta, without rupture: Secondary | ICD-10-CM

## 2024-03-04 ENCOUNTER — Other Ambulatory Visit

## 2024-03-13 ENCOUNTER — Ambulatory Visit

## 2024-04-09 NOTE — Progress Notes (Deleted)
 [address]               [place], [place] [address]                      [phone]   PCP is Charlott Dorn LABOR, MD Referring Provider is Charlott Dorn LABOR, *  Chief Complaint: Ascending thoracic aortic aneurysm   HPI: This is a 63 year old male with a past medical history of hypertension who was incidentally found to have an ascending thoracic aortic aneurysm in 2022. He was last seen by my colleague in May 2024 and the  ATAA measured 4.3 cm at that time. He presents today for further surveillance of his ATAA. He denies chest pain, pressure, or tightness.  Past Medical History:  Diagnosis Date   Hypertension     Past Surgical History:  Procedure Laterality Date   sebacceous cyst of knee      Family History  Problem Relation Age of Onset   Hypertension Other     Social History Social History   Tobacco Use   Smoking status: Never  Substance Use Topics   Alcohol use: Yes   Drug use: No    Current Outpatient Medications  Medication Sig Dispense Refill   amLODipine  (NORVASC ) 2.5 MG tablet TAKE 1 TABLET DAILY (NEED OFFICE VISIT BEFORE ANY MORE REFILLS, LAST OFFICE VISIT WAS 05/2012) 30 tablet 0   aspirin 81 MG tablet Take 81 mg by mouth daily.     atenolol  (TENORMIN ) 50 MG tablet TAKE 1 TABLET DAILY (Patient taking differently: Take 25 mg by mouth daily.) 90 tablet 2   atorvastatin (LIPITOR) 10 MG tablet TAKE 1 TABLET DAILY 90 tablet 1   gabapentin (NEURONTIN) 100 MG capsule Take 1 capsule by mouth 3 (three) times daily.     glucosamine-chondroitin 500-400 MG tablet Take 1 tablet by mouth daily.     Omega-3 Fatty Acids (FISH OIL) 500 MG CAPS Take 500 capsules by mouth.     telmisartan -hydrochlorothiazide (MICARDIS  HCT) 80-12.5 MG per tablet Take 1 tablet by mouth daily. 90 tablet 3   Turmeric 500 MG CAPS Take by mouth daily.     zolpidem  (AMBIEN ) 10 MG tablet Take 1 tablet (10 mg total) by mouth at bedtime as needed. (Patient not  taking: Reported on 03/13/2023) 30 tablet 0   Allergies  Allergen Reactions   No Known Allergies    Vital Signs:  Physical Exam: CV Pulmonary Abdomen Extremities Neurologic  Diagnostic Tests:  Narrative & Impression  CLINICAL DATA:  Follow-up aortic aneurysm.   EXAM: CT CHEST WITHOUT CONTRAST   TECHNIQUE: Multidetector CT imaging of the chest was performed following the standard protocol without IV contrast.   RADIATION DOSE REDUCTION: This exam was performed according to the departmental dose-optimization program which includes automated exposure control, adjustment of the mA and/or kV according to patient size and/or use of iterative reconstruction technique.   COMPARISON:  CT abdomen pelvis dated 03/13/2023.   FINDINGS: Evaluation of this exam is limited in the absence of intravenous contrast.   Cardiovascular: There is no cardiomegaly or pericardial effusion. Stable 4.2 cm dilatation of the ascending aorta. The central pulmonary arteries are grossly unremarkable.   Mediastinum/Nodes: No hilar or mediastinal adenopathy. The esophagus and the thyroid gland are grossly unremarkable. No mediastinal fluid collection.   Lungs/Pleura: No focal consolidation, pleural effusion, pneumothorax. The central airways are patent.  Upper Abdomen: No acute abnormality.   Musculoskeletal: Degenerative changes of the spine. No acute osseous pathology.   IMPRESSION: 1. Stable 4.2 cm dilatation of the ascending aorta. Recommend annual imaging followup by CTA or MRA. This recommendation follows 2010 ACCF/AHA/AATS/ACR/ASA/SCA/SCAI/SIR/STS/SVM Guidelines for the Diagnosis and Management of Patients with Thoracic Aortic Disease. Circulation. 2010; 121: Z733-z630. Aortic aneurysm NOS (ICD10-I71.9) 2. No acute intrathoracic pathology.     Electronically Signed   By: Vanetta Chou M.D.   On: 02/20/2024 16:43   Risk Modification in those with ascending thoracic aortic  aneurysm:  Continue good control of blood pressure (prefer SBP 130/80 or less)-continue Amlodipine  (Norvasc ), Losartan (Cozaar), and Telmisartan /HCTZ   2. Avoid fluoroquinolone antibiotics (I.e Ciprofloxacin, Avelox, Levofloxacin, Ofloxacin)  3.  Use of statin (to decrease cardiovascular risk)-continue Atorvastatin (Lipitor)  4.  Exercise and activity limitations is individualized, but in general, contact sports are to be  avoided and one should avoid heavy lifting (defined as half of ideal body weight) and exercises involving sustained Valsalva maneuver.  5. Counseling for those suspected of having genetically mediated disease. First-degree relatives of those with TAA disease should be screened as well as those who have a connective tissue disease (I.e with Marfan syndrome, Ehlers-Danlos syndrome,  and Loeys-Dietz syndrome) or a  bicuspid aortic valve,have an increased risk for  complications related to TAA. He denies family history of TAA and personal history of connective tissue disorder.   6. He has no history of tobacco abuse   Impression and Plan: CT of the chest without contrast showed a 4.2 cm ascending aortic aneurysm.    We discussed the natural history and and risk factors for growth of ascending aortic aneurysms.  We covered the importance of smoking cessation, tight blood pressure control, refraining from lifting heavy objects, and avoiding fluoroquinolones.  The patient is aware of signs and symptoms of aortic dissection and when to present to the emergency department.  We will continue surveillance and a repeat CT of chest without contrast was ordered for 1 year.  Kyla CHRISTELLA Donald, PA-C [facility] [phone]

## 2024-04-23 ENCOUNTER — Ambulatory Visit

## 2024-05-05 NOTE — Progress Notes (Signed)
 [address] [place] [address]             [phone]            [name] [id number] April 11, 1961   History of Present Illness:  Mr. Kyle Mcdaniel is a 63 year old man with history of hypertension and hyperlipidemia presents today for continue surveillance of ascending thoracic aneurysm. He had CTA of chest which measured aneurysm at 4.2 cm.  He does have hypertension which is controlled.  He reports that he does check his blood pressures at home and has been getting readings in the 130s/80s.  He is concerned that sometimes his diastolic BP is in the 90s, which is not frequent. He is currently working with his PCP to help manage his blood pressure.  He denies chest pain and shortness of breath.  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 He does walk for exercise.      Current Outpatient Medications on File Prior to Visit  Medication Sig Dispense Refill   amLODipine  (NORVASC ) 2.5 MG tablet TAKE 1 TABLET DAILY (NEED OFFICE VISIT BEFORE ANY MORE REFILLS, LAST OFFICE VISIT WAS 05/2012) 30 tablet 0   aspirin 81 MG tablet Take 81 mg by mouth daily.     atenolol  (TENORMIN ) 50 MG tablet TAKE 1 TABLET DAILY (Patient taking differently: Take 25 mg by mouth daily.) 90 tablet 2   atorvastatin (LIPITOR) 10 MG tablet TAKE 1 TABLET DAILY 90 tablet 1   gabapentin (NEURONTIN) 100 MG capsule Take 1 capsule by mouth 3 (three) times daily.     glucosamine-chondroitin 500-400 MG tablet Take 1 tablet by mouth daily.     Omega-3 Fatty Acids (FISH OIL) 500 MG CAPS Take 500 capsules by mouth.     telmisartan -hydrochlorothiazide (MICARDIS  HCT) 80-12.5 MG per tablet Take 1 tablet by mouth daily. 90 tablet 3   Turmeric 500 MG CAPS Take by mouth daily.     zolpidem  (AMBIEN ) 10 MG tablet Take 1 tablet (10 mg total) by mouth at bedtime as needed. (Patient not taking: Reported on 03/13/2023) 30 tablet 0   No current facility-administered medications  on file prior to visit.     ROS: Review of Systems  Constitutional: Negative.  Negative for chills and fever.  Respiratory: Negative.  Negative for cough and shortness of breath.   Cardiovascular: Negative.  Negative for chest pain and palpitations.  Neurological: Negative.  Negative for headaches.     BP 133/87   Pulse 65   Resp 18   Ht 6' (1.829 m)   Wt 261 lb (118.4 kg)   SpO2 96%   BMI 35.40 kg/m   Physical Exam Constitutional:      Appearance: Normal appearance.  HENT:     Head: Normocephalic and atraumatic.  Cardiovascular:     Rate and Rhythm: Normal rate and regular rhythm.     Heart sounds: Normal heart sounds, S1 normal and S2 normal.  Pulmonary:     Effort: Pulmonary effort is normal.     Breath sounds: Normal breath sounds.  Skin:    General: Skin is warm and dry.  Neurological:     General: No focal deficit present.     Mental Status: He is alert and oriented to person, place, and time.       Imaging: CLINICAL DATA:  Follow-up aortic aneurysm.   EXAM: CT CHEST  WITHOUT CONTRAST   TECHNIQUE: Multidetector CT imaging of the chest was performed following the standard protocol without IV contrast.   RADIATION DOSE REDUCTION: This exam was performed according to the departmental dose-optimization program which includes automated exposure control, adjustment of the mA and/or kV according to patient size and/or use of iterative reconstruction technique.   COMPARISON:  CT abdomen pelvis dated 03/13/2023.   FINDINGS: Evaluation of this exam is limited in the absence of intravenous contrast.   Cardiovascular: There is no cardiomegaly or pericardial effusion. Stable 4.2 cm dilatation of the ascending aorta. The central pulmonary arteries are grossly unremarkable.   Mediastinum/Nodes: No hilar or mediastinal adenopathy. The esophagus and the thyroid gland are grossly unremarkable. No mediastinal fluid collection.   Lungs/Pleura: No focal  consolidation, pleural effusion, pneumothorax. The central airways are patent.   Upper Abdomen: No acute abnormality.   Musculoskeletal: Degenerative changes of the spine. No acute osseous pathology.   IMPRESSION: 1. Stable 4.2 cm dilatation of the ascending aorta. Recommend annual imaging followup by CTA or MRA. This recommendation follows 2010 ACCF/AHA/AATS/ACR/ASA/SCA/SCAI/SIR/STS/SVM Guidelines for the Diagnosis and Management of Patients with Thoracic Aortic Disease. Circulation. 2010; 121: [id number]. Aortic aneurysm NOS (ICD10-I71.9) 2. No acute intrathoracic pathology.     Electronically Signed   By: Vanetta Chou M.D.   On: 02/20/2024 16:43     A/P: Aneurysm of ascending aorta without rupture (HCC) -4.2 cm ascending thoracic aortic aneurysm measured on CTA of chest.  We discussed the natural history and and risk factors for growth of ascending aortic aneurysms. Discussed recommendations to minimize the risk of further expansion or dissection including careful blood pressure control, avoidance of contact sports and heavy lifting, attention to lipid management.  We covered the importance of continuing to remain tobacco free. The patient does not yet meet surgical criteria of >5.5cm. The patient is aware of signs and symptoms of aortic dissection and when to present to the emergency department    -Follow up one year with CTA of chest  Risk Modification:  Statin:  atorvastatin 5 mg  Smoking cessation instruction/counseling given:  commended patient for quitting and reviewed strategies for preventing relapses  Patient was counseled on importance of Blood Pressure Control  They are instructed to contact their Primary Care Physician if they start to have blood pressure readings over 130s/90s. Do not ever stop blood pressure medications on your own, unless instructed by healthcare professional.  Please avoid use of Fluoroquinolones as this can potentially increase your risk  of Aortic Rupture and/or Dissection  Patient educated on signs and symptoms of Aortic Dissection, handout also provided in AVS  Manuelita CHRISTELLA Rough, PA-C 05/06/24

## 2024-05-06 ENCOUNTER — Ambulatory Visit

## 2024-05-06 VITALS — BP 133/87 | HR 65 | Resp 18 | Ht 72.0 in | Wt 261.0 lb

## 2024-05-06 DIAGNOSIS — I7121 Aneurysm of the ascending aorta, without rupture: Secondary | ICD-10-CM | POA: Diagnosis not present

## 2024-05-06 NOTE — Patient Instructions (Signed)
